# Patient Record
Sex: Female | Born: 1960 | Race: White | Hispanic: No | Smoking: Former smoker
Health system: Southern US, Community
[De-identification: ages and names within clinical notes are randomized; demographics above are authoritative.]

## PROBLEM LIST (undated history)

## (undated) DIAGNOSIS — C649 Malignant neoplasm of unspecified kidney, except renal pelvis: Secondary | ICD-10-CM

## (undated) DIAGNOSIS — Z8774 Personal history of (corrected) congenital malformations of heart and circulatory system: Secondary | ICD-10-CM

## (undated) DIAGNOSIS — G4700 Insomnia, unspecified: Secondary | ICD-10-CM

## (undated) DIAGNOSIS — R768 Other specified abnormal immunological findings in serum: Secondary | ICD-10-CM

## (undated) DIAGNOSIS — E785 Hyperlipidemia, unspecified: Secondary | ICD-10-CM

## (undated) DIAGNOSIS — K219 Gastro-esophageal reflux disease without esophagitis: Secondary | ICD-10-CM

## (undated) DIAGNOSIS — Z5181 Encounter for therapeutic drug level monitoring: Secondary | ICD-10-CM

## (undated) DIAGNOSIS — F329 Major depressive disorder, single episode, unspecified: Secondary | ICD-10-CM

## (undated) DIAGNOSIS — C787 Secondary malignant neoplasm of liver and intrahepatic bile duct: Secondary | ICD-10-CM

## (undated) DIAGNOSIS — Z7901 Long term (current) use of anticoagulants: Principal | ICD-10-CM

## (undated) DIAGNOSIS — Z8614 Personal history of Methicillin resistant Staphylococcus aureus infection: Secondary | ICD-10-CM

## (undated) DIAGNOSIS — M8580 Other specified disorders of bone density and structure, unspecified site: Secondary | ICD-10-CM

## (undated) DIAGNOSIS — I442 Atrioventricular block, complete: Secondary | ICD-10-CM

## (undated) DIAGNOSIS — Z950 Presence of cardiac pacemaker: Secondary | ICD-10-CM

## (undated) DIAGNOSIS — J309 Allergic rhinitis, unspecified: Secondary | ICD-10-CM

## (undated) DIAGNOSIS — F32A Depression, unspecified: Secondary | ICD-10-CM

## (undated) DIAGNOSIS — E559 Vitamin D deficiency, unspecified: Secondary | ICD-10-CM

## (undated) DIAGNOSIS — I513 Intracardiac thrombosis, not elsewhere classified: Secondary | ICD-10-CM

## (undated) DIAGNOSIS — D4989 Neoplasm of unspecified behavior of other specified sites: Secondary | ICD-10-CM

## (undated) DIAGNOSIS — M1990 Unspecified osteoarthritis, unspecified site: Secondary | ICD-10-CM

## (undated) DIAGNOSIS — C7800 Secondary malignant neoplasm of unspecified lung: Secondary | ICD-10-CM

## (undated) HISTORY — PX: CHOLECYSTECTOMY: SHX55

## (undated) HISTORY — DX: Secondary malignant neoplasm of unspecified lung: C78.00

## (undated) HISTORY — DX: Presence of cardiac pacemaker: Z95.0

## (undated) HISTORY — DX: Hyperlipidemia, unspecified: E78.5

## (undated) HISTORY — PX: CARDIAC SURGERY: SHX584

## (undated) HISTORY — DX: Other specified disorders of bone density and structure, unspecified site: M85.80

## (undated) HISTORY — DX: Encounter for therapeutic drug level monitoring: Z51.81

## (undated) HISTORY — DX: Vitamin D deficiency, unspecified: E55.9

## (undated) HISTORY — DX: Unspecified osteoarthritis, unspecified site: M19.90

## (undated) HISTORY — PX: ABDOMINAL HYSTERECTOMY: SHX81

## (undated) HISTORY — DX: Major depressive disorder, single episode, unspecified: F32.9

## (undated) HISTORY — PX: KNEE SURGERY: SHX244

## (undated) HISTORY — DX: Depression, unspecified: F32.A

## (undated) HISTORY — DX: Other specified abnormal immunological findings in serum: R76.8

## (undated) HISTORY — DX: Gastro-esophageal reflux disease without esophagitis: K21.9

## (undated) HISTORY — DX: Neoplasm of unspecified behavior of other specified sites: D49.89

## (undated) HISTORY — DX: Atrioventricular block, complete: I44.2

## (undated) HISTORY — DX: Allergic rhinitis, unspecified: J30.9

## (undated) HISTORY — DX: Insomnia, unspecified: G47.00

## (undated) HISTORY — DX: Intracardiac thrombosis, not elsewhere classified: I51.3

## (undated) HISTORY — DX: Long term (current) use of anticoagulants: Z79.01

## (undated) HISTORY — DX: Malignant neoplasm of unspecified kidney, except renal pelvis: C64.9

## (undated) HISTORY — PX: ELBOW SURGERY: SHX618

## (undated) HISTORY — DX: Personal history of Methicillin resistant Staphylococcus aureus infection: Z86.14

## (undated) HISTORY — DX: Secondary malignant neoplasm of liver and intrahepatic bile duct: C78.7

## (undated) HISTORY — DX: Personal history of (corrected) congenital malformations of heart and circulatory system: Z87.74

---

## 2008-04-29 ENCOUNTER — Ambulatory Visit: Payer: Self-pay | Admitting: Gastroenterology

## 2008-04-29 LAB — HM COLONOSCOPY

## 2008-05-06 ENCOUNTER — Emergency Department: Payer: Self-pay | Admitting: Emergency Medicine

## 2010-08-04 ENCOUNTER — Ambulatory Visit: Payer: Self-pay

## 2011-03-23 LAB — HM PAP SMEAR

## 2013-10-10 DIAGNOSIS — M8580 Other specified disorders of bone density and structure, unspecified site: Secondary | ICD-10-CM

## 2013-10-10 HISTORY — DX: Other specified disorders of bone density and structure, unspecified site: M85.80

## 2013-10-22 LAB — HM DEXA SCAN

## 2014-05-10 ENCOUNTER — Emergency Department: Payer: Self-pay | Admitting: Emergency Medicine

## 2014-11-16 DIAGNOSIS — E559 Vitamin D deficiency, unspecified: Secondary | ICD-10-CM | POA: Insufficient documentation

## 2014-11-16 DIAGNOSIS — Z8614 Personal history of Methicillin resistant Staphylococcus aureus infection: Secondary | ICD-10-CM | POA: Insufficient documentation

## 2014-11-16 DIAGNOSIS — K219 Gastro-esophageal reflux disease without esophagitis: Secondary | ICD-10-CM | POA: Insufficient documentation

## 2014-11-16 DIAGNOSIS — F329 Major depressive disorder, single episode, unspecified: Secondary | ICD-10-CM | POA: Insufficient documentation

## 2014-11-16 DIAGNOSIS — F32A Depression, unspecified: Secondary | ICD-10-CM | POA: Insufficient documentation

## 2014-11-16 DIAGNOSIS — E785 Hyperlipidemia, unspecified: Secondary | ICD-10-CM | POA: Insufficient documentation

## 2014-11-16 DIAGNOSIS — J309 Allergic rhinitis, unspecified: Secondary | ICD-10-CM | POA: Insufficient documentation

## 2014-11-16 DIAGNOSIS — G4700 Insomnia, unspecified: Secondary | ICD-10-CM | POA: Insufficient documentation

## 2014-11-19 ENCOUNTER — Ambulatory Visit (INDEPENDENT_AMBULATORY_CARE_PROVIDER_SITE_OTHER): Payer: 59 | Admitting: Family Medicine

## 2014-11-19 ENCOUNTER — Encounter: Payer: Self-pay | Admitting: Family Medicine

## 2014-11-19 VITALS — BP 105/66 | HR 79 | Temp 98.6°F | Ht 62.5 in | Wt 116.0 lb

## 2014-11-19 DIAGNOSIS — Z23 Encounter for immunization: Secondary | ICD-10-CM

## 2014-11-19 DIAGNOSIS — F32A Depression, unspecified: Secondary | ICD-10-CM

## 2014-11-19 DIAGNOSIS — R42 Dizziness and giddiness: Secondary | ICD-10-CM | POA: Diagnosis not present

## 2014-11-19 DIAGNOSIS — F329 Major depressive disorder, single episode, unspecified: Secondary | ICD-10-CM | POA: Diagnosis not present

## 2014-11-19 MED ORDER — SERTRALINE HCL 50 MG PO TABS: 50.0000 mg | ORAL_TABLET | Freq: Every day | ORAL | Status: AC

## 2014-11-19 MED ORDER — TRAZODONE HCL 100 MG PO TABS: 100.0000 mg | ORAL_TABLET | Freq: Every day | ORAL | Status: AC

## 2014-11-19 MED ORDER — MECLIZINE HCL 25 MG PO TABS: 25.0000 mg | ORAL_TABLET | Freq: Three times a day (TID) | ORAL | Status: DC | PRN

## 2014-11-19 NOTE — Progress Notes (Signed)
BP 105/66 mmHg  Pulse 79  Temp(Src) 98.6 F (37 C)  Ht 5' 2.5" (1.588 m)  Wt 116 lb (52.617 kg)  BMI 20.87 kg/m2  SpO2 99%   Subjective:    Patient ID: Tracy Mullins, female    DOB: April 03, 1960, 54 y.o.   MRN: [id number]  HPI: Tracy Mullins is a 54 y.o. female  Chief Complaint  Patient presents with  . Dizziness    had been having it for 2 weeks, but it has resolved now.   She thinks the marbles in her head have all gone back into place; she has had this before; she says that she used to take medicine for this, used to take antivert; would like refill; no head injury; no stroke symptoms, no headache  She says she needs some cuckoo medicine; she ran out of her medicine; I don't see any requests for her prescriptions; last dose was a month ago; she thinks the dizziness might be from stopping the medicine; cried; no trouble with sleep since she takes trazodone; no anxiety; weight pretty stable; no thoughts of self-harm or others  Relevant past medical, surgical, family and social history reviewed and updated as indicated. Interim medical history since our last visit reviewed. Allergies and medications reviewed and updated.  Review of Systems  Per HPI unless specifically indicated above     Objective:    BP 105/66 mmHg  Pulse 79  Temp(Src) 98.6 F (37 C)  Ht 5' 2.5" (1.588 m)  Wt 116 lb (52.617 kg)  BMI 20.87 kg/m2  SpO2 99%  Wt Readings from Last 3 Encounters:  11/19/14 116 lb (52.617 kg)  05/10/14 115 lb (52.164 kg)    Physical Exam  Constitutional: She appears well-developed and well-nourished.  Weight stable  HENT:  Head: Normocephalic and atraumatic.  Right Ear: Hearing, tympanic membrane, external ear and ear canal normal. Tympanic membrane is not injected, not erythematous and not retracted. No middle ear effusion.  Left Ear: Hearing, tympanic membrane, external ear and ear canal normal. Tympanic membrane is not injected, not erythematous and not retracted.   No middle ear effusion.  Nose: No rhinorrhea.  Mouth/Throat: Oropharynx is clear and moist and mucous membranes are normal. Mucous membranes are not pale. No oropharyngeal exudate, posterior oropharyngeal edema or posterior oropharyngeal erythema.  Eyes: EOM are normal. No scleral icterus.  Neck: Carotid bruit is not present.  Cardiovascular: Normal rate and regular rhythm.   Pulmonary/Chest: Effort normal and breath sounds normal.  Neurological: She is alert. She has normal strength. She displays no tremor. No cranial nerve deficit. She exhibits normal muscle tone. Gait normal.  Psychiatric: She has a normal mood and affect. Her speech is normal and behavior is normal.    Results for orders placed or performed in visit on 11/16/14  HM DEXA SCAN  Result Value Ref Range   HM Dexa Scan per PP   HM PAP SMEAR  Result Value Ref Range   HM Pap smear per PP   HM COLONOSCOPY  Result Value Ref Range   HM Colonoscopy per PP       Assessment & Plan:   Problem List Items Addressed This Visit      Other   Depression    Get started back on SSRI; refills provided      Relevant Medications   sertraline (ZOLOFT) 50 MG tablet   traZODone (DESYREL) 100 MG tablet   Dizziness - Primary    Likely combination of vertigo and  sensitivity to coming off of SSRI abruptly; meclizine Rx provided if needed in future; start back on SSRI, don't stop abruptly in future, would taper; no s/s of stroke or red flags       Other Visit Diagnoses    Encounter for immunization        flu shot given today       Follow up plan: Return in about 5 months (around 04/29/2015) for for fasting labs, physical .  Meds ordered this encounter  Medications  . sertraline (ZOLOFT) 50 MG tablet    Sig: Take 1 tablet (50 mg total) by mouth daily.    Dispense:  30 tablet    Refill:  12  . traZODone (DESYREL) 100 MG tablet    Sig: Take 1 tablet (100 mg total) by mouth at bedtime.    Dispense:  30 tablet    Refill:  12   . meclizine (ANTIVERT) 25 MG tablet    Sig: Take 1 tablet (25 mg total) by mouth 3 (three) times daily as needed for dizziness.    Dispense:  30 tablet    Refill:  0

## 2014-11-19 NOTE — Patient Instructions (Signed)
Please return on or after April 30, 2015 for fasting labs; okay to combine with your physical if due around then Get back on your medicines Take just half of a sertraline (zoloft) daily for the first four days and then a whole pill daily Use the meclizine (antivert) just if needed

## 2014-11-19 NOTE — Assessment & Plan Note (Signed)
Get started back on SSRI; refills provided

## 2014-11-19 NOTE — Assessment & Plan Note (Signed)
Likely combination of vertigo and sensitivity to coming off of SSRI abruptly; meclizine Rx provided if needed in future; start back on SSRI, don't stop abruptly in future, would taper; no s/s of stroke or red flags

## 2015-03-12 ENCOUNTER — Other Ambulatory Visit: Payer: Self-pay | Admitting: Family Medicine

## 2015-03-12 NOTE — Telephone Encounter (Signed)
Patient was due for follow-up in August and has not had kidney function checked in over a year Please let Tracy Mullins know that I'd like to see patient for an appointment here in the office for:   Please schedule a visit with me in the next: few weeks Fasting?  Yes please Thank you, Dr. Sanda Klein Back to me to refill the medicine once she's scheduled appt

## 2015-03-15 NOTE — Telephone Encounter (Signed)
Patient schedule f/u 03/30/14.

## 2015-03-31 ENCOUNTER — Encounter: Payer: Self-pay | Admitting: Family Medicine

## 2015-03-31 ENCOUNTER — Ambulatory Visit (INDEPENDENT_AMBULATORY_CARE_PROVIDER_SITE_OTHER): Payer: 59 | Admitting: Family Medicine

## 2015-03-31 VITALS — BP 106/66 | HR 93 | Temp 98.7°F | Ht 62.5 in | Wt 114.4 lb

## 2015-03-31 DIAGNOSIS — Z5181 Encounter for therapeutic drug level monitoring: Secondary | ICD-10-CM

## 2015-03-31 DIAGNOSIS — F329 Major depressive disorder, single episode, unspecified: Secondary | ICD-10-CM

## 2015-03-31 DIAGNOSIS — E785 Hyperlipidemia, unspecified: Secondary | ICD-10-CM | POA: Diagnosis not present

## 2015-03-31 DIAGNOSIS — E559 Vitamin D deficiency, unspecified: Secondary | ICD-10-CM | POA: Diagnosis not present

## 2015-03-31 DIAGNOSIS — G4700 Insomnia, unspecified: Secondary | ICD-10-CM

## 2015-03-31 DIAGNOSIS — K219 Gastro-esophageal reflux disease without esophagitis: Secondary | ICD-10-CM | POA: Diagnosis not present

## 2015-03-31 DIAGNOSIS — F32A Depression, unspecified: Secondary | ICD-10-CM

## 2015-03-31 MED ORDER — SIMVASTATIN 10 MG PO TABS: 10.0000 mg | ORAL_TABLET | Freq: Every day | ORAL | Status: DC

## 2015-03-31 MED ORDER — ASPIRIN EC 81 MG PO TBEC: 81.0000 mg | DELAYED_RELEASE_TABLET | Freq: Every day | ORAL | Status: DC

## 2015-03-31 MED ORDER — MELOXICAM 15 MG PO TABS: 15.0000 mg | ORAL_TABLET | Freq: Every day | ORAL | Status: DC | PRN

## 2015-03-31 NOTE — Assessment & Plan Note (Signed)
Doing well with trazodone; continue same

## 2015-03-31 NOTE — Progress Notes (Signed)
BP 106/66 mmHg  Pulse 93  Temp(Src) 98.7 F (37.1 C)  Ht 5' 2.5" (1.588 m)  Wt 114 lb 6.4 oz (51.891 kg)  BMI 20.58 kg/m2  SpO2 98%   Subjective:    Patient ID: Tracy Mullins, female    DOB: 1961-02-29, 55 y.o.   MRN: [id number]  HPI: Tracy Mullins is a 55 y.o. female  Chief Complaint  Patient presents with  . Hyperlipidemia  . Medication Refill    pt states she needs refill on meloxicam and simvastatin  . 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 Jefm Bryant; arthritis runs in the family  She takes sertraline; depression and anxiety; she feels like it is working well; wishes to stay on same dose  She takes trazodone for sleep; that is working well  We talked about shingles vaccine; recommended waiting until age 68  Relevant past medical, surgical, family and social history reviewed and updated as indicated Father had heart disease, massive heart attack at age 10 Interim medical history since our last visit reviewed. No medical excitement  Allergies and medications reviewed and updated. .med Review of Systems  Per HPI unless specifically indicated above     Objective:    BP 106/66 mmHg  Pulse 93  Temp(Src) 98.7 F (37.1 C)  Ht 5' 2.5" (1.588 m)  Wt 114 lb 6.4 oz (51.891 kg)  BMI 20.58 kg/m2  SpO2 98%  Wt Readings from Last 3 Encounters:  04/07/15 115 lb (52.164 kg)  03/31/15 114 lb 6.4 oz (51.891 kg)  11/19/14 116 lb  (52.617 kg)    Physical Exam  Constitutional: She appears well-developed and well-nourished. No distress.  Eyes: EOM are normal. No scleral icterus.  Cardiovascular: Normal rate and regular rhythm.   Pulmonary/Chest: Effort normal and breath sounds normal.  Abdominal: Soft. Bowel sounds are normal. She exhibits no distension.  Musculoskeletal: She exhibits no edema.  Neurological: She is alert.  Skin: Skin is warm. No pallor.  Psychiatric: She has a normal mood and affect.       Assessment & Plan:   Problem List Items Addressed This Visit      Digestive   GERD (gastroesophageal reflux disease)    Well-controlled; not bothered by NSAID; no blood in the stool        Other   Hyperlipidemia - Primary    Avoid saturated fats; check cholesterol today; continue statin      Relevant Orders   Lipid Panel w/o Chol/HDL Ratio (Completed)   Depression    Doing well on sertraline, continue same dose      Insomnia    Doing well with trazodone; continue same      Vitamin D deficiency disease    Check level and supplement today      Relevant Orders   VITAMIN D 25 Hydroxy (Vit-D Deficiency, Fractures) (Completed)   Medication monitoring encounter  Relevant Orders   CBC with Differential/Platelet (Completed)   Comprehensive metabolic panel (Completed)      Follow up plan: Return in about 6 months (around 09/28/2015) for thirty minute follow-up with fasting labs, keep CPE appt.  Orders Placed This Encounter  Procedures  . Lipid Panel w/o Chol/HDL Ratio  . CBC with Differential/Platelet  . Comprehensive metabolic panel  . VITAMIN D 25 Hydroxy (Vit-D Deficiency, Fractures)

## 2015-03-31 NOTE — Assessment & Plan Note (Signed)
Avoid saturated fats; check cholesterol today; continue statin

## 2015-03-31 NOTE — Assessment & Plan Note (Signed)
Well-controlled; not bothered by NSAID; no blood in the stool

## 2015-03-31 NOTE — Patient Instructions (Signed)
Try to limit saturated fats in your diet (bologna, hot dogs, barbeque, cheeseburgers, hamburgers, steak, bacon, sausage, cheese, etc.) and get more fresh fruits, vegetables, and whole grains You can get a shingles vaccine at age 56 We'll get labs today Start a baby 81 mg coated aspirin once a day for heart protection Take your meloxicam at least one hour AFTER the aspirin Return for your physical

## 2015-03-31 NOTE — Assessment & Plan Note (Signed)
Check level and supplement today

## 2015-03-31 NOTE — Assessment & Plan Note (Signed)
Doing well on sertraline, continue same dose

## 2015-04-01 LAB — LIPID PANEL W/O CHOL/HDL RATIO
Cholesterol, Total: 137 mg/dL (ref 100–199)
HDL: 50 mg/dL (ref >39)
LDL Calculated: 75 mg/dL (ref 0–99)
TRIGLYCERIDES: 61 mg/dL (ref 0–149)
VLDL CHOLESTEROL CAL: 12 mg/dL (ref 5–40)

## 2015-04-01 LAB — CBC WITH DIFFERENTIAL/PLATELET
BASOS: 1 %
Basophils Absolute: 0 10*3/uL (ref 0.0–0.2)
EOS (ABSOLUTE): 0.2 10*3/uL (ref 0.0–0.4)
EOS: 4 %
HEMATOCRIT: 27.5 % — AB (ref 34.0–46.6)
Hemoglobin: 8.7 g/dL — ABNORMAL LOW (ref 11.1–15.9)
Immature Grans (Abs): 0 10*3/uL (ref 0.0–0.1)
Immature Granulocytes: 0 %
LYMPHS ABS: 1.5 10*3/uL (ref 0.7–3.1)
Lymphs: 23 %
MCH: 23.3 pg — AB (ref 26.6–33.0)
MCHC: 31.6 g/dL (ref 31.5–35.7)
MCV: 74 fL — AB (ref 79–97)
MONOS ABS: 0.7 10*3/uL (ref 0.1–0.9)
Monocytes: 11 %
NEUTROS ABS: 3.9 10*3/uL (ref 1.4–7.0)
Neutrophils: 61 %
PLATELETS: 546 10*3/uL — AB (ref 150–379)
RBC: 3.73 x10E6/uL — ABNORMAL LOW (ref 3.77–5.28)
RDW: 15.1 % (ref 12.3–15.4)
WBC: 6.4 10*3/uL (ref 3.4–10.8)

## 2015-04-01 LAB — COMPREHENSIVE METABOLIC PANEL
A/G RATIO: 1.1 (ref 1.1–2.5)
ALK PHOS: 177 IU/L — AB (ref 39–117)
ALT: 7 IU/L (ref 0–32)
AST: 16 IU/L (ref 0–40)
Albumin: 3.5 g/dL (ref 3.5–5.5)
BILIRUBIN TOTAL: 0.2 mg/dL (ref 0.0–1.2)
BUN/Creatinine Ratio: 14 (ref 9–23)
BUN: 10 mg/dL (ref 6–24)
CALCIUM: 9.3 mg/dL (ref 8.7–10.2)
CHLORIDE: 100 mmol/L (ref 96–106)
CO2: 26 mmol/L (ref 18–29)
Creatinine, Ser: 0.72 mg/dL (ref 0.57–1.00)
GFR calc Af Amer: 109 mL/min/{1.73_m2} (ref >59)
GFR, EST NON AFRICAN AMERICAN: 95 mL/min/{1.73_m2} (ref >59)
GLOBULIN, TOTAL: 3.3 g/dL (ref 1.5–4.5)
Glucose: 88 mg/dL (ref 65–99)
POTASSIUM: 5 mmol/L (ref 3.5–5.2)
SODIUM: 140 mmol/L (ref 134–144)
Total Protein: 6.8 g/dL (ref 6.0–8.5)

## 2015-04-01 LAB — VITAMIN D 25 HYDROXY (VIT D DEFICIENCY, FRACTURES): Vit D, 25-Hydroxy: 22.9 ng/mL — ABNORMAL LOW (ref 30.0–100.0)

## 2015-04-05 ENCOUNTER — Telehealth: Payer: Self-pay | Admitting: Family Medicine

## 2015-04-05 DIAGNOSIS — D649 Anemia, unspecified: Secondary | ICD-10-CM | POA: Insufficient documentation

## 2015-04-05 DIAGNOSIS — R748 Abnormal levels of other serum enzymes: Secondary | ICD-10-CM

## 2015-04-05 NOTE — Telephone Encounter (Signed)
I have tried to call patient, unable to leave a message, tried different numbers in the voicemail system (reached recording) Nothing under media tab for scanned documents for other contacts I then tried [place], he said patient does not live there anymore, would not give new number ---------------------- [facility] staff --> I need to reach patient I cannot find any other numbers to contact her, and I need to talk with her about her labs Tell her I'm trying to reach her and that she is anemic and has other lab abnormalities Bring her in Wednesday for a recheck CBC and to see her ASAP Please try any way you can to reach her, get her to come in Wed morning if at all possible; double-book and we need an in-house CBC before I see her, along with other labs

## 2015-04-05 NOTE — Telephone Encounter (Signed)
Call patient with lab results; anemic

## 2015-04-06 ENCOUNTER — Other Ambulatory Visit: Payer: 59

## 2015-04-06 DIAGNOSIS — R748 Abnormal levels of other serum enzymes: Secondary | ICD-10-CM

## 2015-04-06 DIAGNOSIS — D649 Anemia, unspecified: Secondary | ICD-10-CM

## 2015-04-06 NOTE — Telephone Encounter (Signed)
I called patient, left detailed message; her hemoglobin is low at 8.1, she is losing even more blood; explained on machine that we usually transfuse when it gets under 8, so if she feels badly or gets worse or any issues overnight, go to the ER I want to see her in the office tomorrow; she can come in first thing, 7:50 am and I'll see her then or we can see her at the end of the morning Start iron 325 mg daily I have already put in referral to GI, other labs are pending ------------------- [facility] staff --> double book patient on Thursday, I'll see her whenever she can be here

## 2015-04-06 NOTE — Telephone Encounter (Signed)
Patient came in for labs, stool cards given.

## 2015-04-06 NOTE — Assessment & Plan Note (Signed)
Check labs today; refer to gastroenterologist; give stool cards to patient today

## 2015-04-06 NOTE — Telephone Encounter (Signed)
Pt called back and stated that she pricked her finger and it was 8.5. Pt also stated she was at work and the best number to reach her at is [phone].

## 2015-04-06 NOTE — Telephone Encounter (Signed)
Dr. Sanda Klein she can be reach at work [phone] or [phone]. 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

## 2015-04-06 NOTE — Telephone Encounter (Signed)
Patient is coming in today for labs per staff Thank you for the note; the anemia is real and it's her blood that had the abnormalities, not lab error then [facility] staff --> please give her stool cards; refer to gastroenterology for consideration of colonoscopy, EGD for microcytic anemia She should have two sets of labs to be drawn; if I misfired because she was coming in today but you can't see them all, come get me; she needs the liver stuff but also ferritin, iron, TIBC, etc

## 2015-04-06 NOTE — Telephone Encounter (Signed)
I spoke with patient; she does not feel bad; we talked about differential; she absolutely cannot come in today she says, but she can prick her finger at work and check Hemoglobin there; she'll do that now and call us back

## 2015-04-07 ENCOUNTER — Ambulatory Visit (INDEPENDENT_AMBULATORY_CARE_PROVIDER_SITE_OTHER): Payer: 59 | Admitting: Family Medicine

## 2015-04-07 ENCOUNTER — Ambulatory Visit
Admission: RE | Admit: 2015-04-07 | Discharge: 2015-04-07 | Disposition: A | Discharge status: Home / Self Care | Payer: Commercial Managed Care - HMO | Source: Ambulatory Visit | Attending: Family Medicine | Admitting: Family Medicine

## 2015-04-07 ENCOUNTER — Encounter: Payer: Self-pay | Admitting: Family Medicine

## 2015-04-07 VITALS — BP 116/69 | HR 70 | Temp 98.3°F | Wt 115.0 lb

## 2015-04-07 DIAGNOSIS — R748 Abnormal levels of other serum enzymes: Secondary | ICD-10-CM | POA: Diagnosis not present

## 2015-04-07 DIAGNOSIS — D649 Anemia, unspecified: Secondary | ICD-10-CM | POA: Insufficient documentation

## 2015-04-07 DIAGNOSIS — R51 Headache: Secondary | ICD-10-CM | POA: Diagnosis not present

## 2015-04-07 DIAGNOSIS — R519 Headache, unspecified: Secondary | ICD-10-CM

## 2015-04-07 DIAGNOSIS — D4102 Neoplasm of uncertain behavior of left kidney: Secondary | ICD-10-CM | POA: Insufficient documentation

## 2015-04-07 LAB — CBC WITH DIFFERENTIAL/PLATELET
HEMATOCRIT: 26 % — AB (ref 34.0–46.6)
HEMATOCRIT: 25.6 % — AB (ref 34.0–46.6)
HEMOGLOBIN: 8.1 g/dL — AB (ref 11.1–15.9)
HEMOGLOBIN: 7.9 g/dL — AB (ref 11.1–15.9)
LYMPHS ABS: 1.8 10*3/uL (ref 0.7–3.1)
Lymphocytes Absolute: 2 10*3/uL (ref 0.7–3.1)
Lymphs: 28 %
Lymphs: 31 %
MCH: 24.5 pg — ABNORMAL LOW (ref 26.6–33.0)
MCH: 24 pg — AB (ref 26.6–33.0)
MCHC: 31.2 g/dL — AB (ref 31.5–35.7)
MCHC: 30.9 g/dL — ABNORMAL LOW (ref 31.5–35.7)
MCV: 79 fL (ref 79–97)
MCV: 78 fL — AB (ref 79–97)
MID (ABSOLUTE): 0.9 10*3/uL (ref 0.1–1.6)
MID (Absolute): 0.8 10*3/uL (ref 0.1–1.6)
MID: 12 %
MID: 14 %
NEUTROS PCT: 61 %
Neutrophils Absolute: 3.9 10*3/uL (ref 1.4–7.0)
Neutrophils Absolute: 3.7 10*3/uL (ref 1.4–7.0)
Neutrophils: 55 %
Platelets: 521 10*3/uL — ABNORMAL HIGH (ref 150–379)
Platelets: 510 10*3/uL — ABNORMAL HIGH (ref 150–379)
RBC: 3.31 x10E6/uL — ABNORMAL LOW (ref 3.77–5.28)
RBC: 3.29 x10E6/uL — AB (ref 3.77–5.28)
RDW: 15.3 % (ref 12.3–15.4)
RDW: 15.3 % (ref 12.3–15.4)
WBC: 6.5 10*3/uL (ref 3.4–10.8)
WBC: 6.6 10*3/uL (ref 3.4–10.8)

## 2015-04-07 MED ORDER — PANTOPRAZOLE SODIUM 40 MG PO TBEC: 40.0000 mg | DELAYED_RELEASE_TABLET | Freq: Two times a day (BID) | ORAL | Status: DC

## 2015-04-07 MED ORDER — IOHEXOL 300 MG/ML  SOLN
100.0000 mL | Freq: Once | INTRAMUSCULAR | Status: AC | PRN
  Administered 2015-04-07: 100 mL via INTRAVENOUS

## 2015-04-07 NOTE — Telephone Encounter (Signed)
I spoke with her; she says she feels fine; I just see now that she was put on the schedule at 1:45 pm today; she feels fine and is going to be here soon; I told her I want a CT scan so we'll get that ordered now I told her to NOT eat or drink before we see her

## 2015-04-07 NOTE — Telephone Encounter (Signed)
Patient called back and stated she can get off work, she will come in at 1:45pm

## 2015-04-07 NOTE — Assessment & Plan Note (Addendum)
Recheck today shows that it has dropped even further from 8.7 to 8.1 to 7.9; stat abd/pelvic CT scan ordered; refer to GI for endoscopy (EGD and colonoscopy) if GI bleed, but Guaiac was negative today (only scant amount of stool present for testing); she will go from here to hospital for stat CT scan; urine ordered; patient instructed to stop all aspirin or NSAID products

## 2015-04-07 NOTE — Telephone Encounter (Addendum)
This message was closed when I got it -- it still requires action, so please continue to add and addend I had really wanted to see her in the office today  Ask patient to check her hemoglobin tomorrow morning at work; call us with results Start iron 325 mg once a day Watch stool for blood Please get her referral to GI [facility] and if GI can't see her, then refer her to general surgeon; she is going to need endoscopy/colonoscopy very very soon I also want a CT scan of her liver and pelvis with contrast today -- ordered placed STAT

## 2015-04-07 NOTE — Patient Instructions (Addendum)
We'll have you get a CT scan and see the gastroenterologist or surgeon pronto to work up your anemia and abnormal test Do not leave the hospital today until we talk about the results STOP any aspirin, meloxicam, non-steroidal medications, anything that might thin your blood or cause bleeding or ulcers START pantoprazole twice a day as an acid reducer Return tomorrow unless things change based on our CT results GINA --> work note

## 2015-04-07 NOTE — Progress Notes (Signed)
BP 116/69 mmHg  Pulse 70  Temp(Src) 98.3 F (36.8 C)  Wt 115 lb (52.164 kg)  SpO2 99%   Subjective:    Patient ID: Tracy Mullins, female    DOB: Dec 14, 1960, 55 y.o.   MRN: [id number]  HPI: Tracy Mullins is a 55 y.o. female  Chief Complaint  Patient presents with  . Anemia    follow up/recheck   Patient is here for an acute visit; she has been found to be anemic She says she thinks she knows what it is from, that she's not eating right She is not aware of any ancestors from the [place] She is not aware of any bleeding from anywhere; then, during the ROS questioning, she says that every once in a while, she'll wipe after going to the bathroom and the tissue with have a little bit of pinkish color  Her labs from one week ago and yesterday are as follows:  Ref Range 1d ago  7d ago      WBC 3.4 - 10.8 x10E3/uL 6.5 6.4    RBC 3.77 - 5.28 x10E6/uL 3.31 (L) 3.73 (L)    Hemoglobin 11.1 - 15.9 g/dL 8.1 (L) 8.7 (L)    Hematocrit 34.0 - 46.6 % 26.0 (L) 27.5 (L)    MCV 79 - 97 fL 79 74 (L)    MCH 26.6 - 33.0 pg 24.5 (L) 23.3 (L)    MCHC 31.5 - 35.7 g/dL 31.2 (L) 31.6    RDW 12.3 - 15.4 % 15.3 15.1    Platelets 150 - 379 x10E3/uL 521 (H) 546 (H)        Her alkaline phosphatase is elevated, and has gone up over the last week   Ref Range 1d ago  7d ago      Total Protein 6.0 - 8.5 g/dL 6.7 6.8    Albumin 3.5 - 5.5 g/dL 3.4 (L) 3.5    Bilirubin Total 0.0 - 1.2 mg/dL <0.2 0.2    Bilirubin, Direct 0.00 - 0.40 mg/dL 0.05         Alkaline Phosphatase 39 - 117 IU/L 209 (H) 177 (H)    AST 0 - 40 IU/L 23 16    ALT 0 - 32 IU/L 14 7          Ref Range 1d ago    Total Iron Binding Capacity 250 - 450 ug/dL 260   UIBC 131 - 425 ug/dL 244   Iron 27 - 159 ug/dL 16 (L)   Iron Saturation 15 - 55 % 6 (LL)        Relevant past medical, surgical, family and social history reviewed and updated as indicated Interim medical history since our last visit  reviewed. Allergies and medications reviewed and updated.  Health Maintenance  Topic Date Due  . MAMMOGRAM  07/06/2015 (Originally 04/04/2013)  . PAP SMEAR  04/06/2018 (Originally 03/22/2014)  . INFLUENZA VACCINE  10/11/2015  . COLONOSCOPY  04/29/2018  . TETANUS/TDAP  02/28/2023  . Hepatitis C Screening  Addressed  . HIV Screening  Addressed   Review of Systems  Constitutional: Positive for fever (a few weeks ago, but she took a motrin and it went away). Negative for unexpected weight change.  Respiratory: Positive for cough (dry cough, just like if you just get a cough).   Gastrointestinal: Positive for constipation and anal bleeding (little bit of blood on the toilet paper when she wiped, it was pinkish; she does that every once ina while). Negative for abdominal  pain and blood in stool.  Genitourinary: Negative for hematuria and flank pain.  Neurological: Positive for headaches (on and off for months, went to ER for this months ago; just come and go).  Per HPI unless specifically indicated above     Objective:    BP 116/69 mmHg  Pulse 70  Temp(Src) 98.3 F (36.8 C)  Wt 115 lb (52.164 kg)  SpO2 99%  Wt Readings from Last 3 Encounters:  04/07/15 115 lb (52.164 kg)  03/31/15 114 lb 6.4 oz (51.891 kg)  11/19/14 116 lb (52.617 kg)   body mass index is 20.69 kg/(m^2).  Physical Exam  Constitutional: She appears well-developed and well-nourished.  Thin frame, but weight overall stable  HENT:  Mouth/Throat: Oropharynx is clear and moist.  Eyes: EOM are normal. Pupils are equal, round, and reactive to light. No scleral icterus.  Neck: No JVD present. No thyromegaly present.  Cardiovascular: Normal rate and regular rhythm.   Pulmonary/Chest: Effort normal and breath sounds normal.  Abdominal: She exhibits mass (fullness midline, just left of midline halfway from umbilicus to xiphoid). She exhibits no distension. There is tenderness (mild) in the epigastric area and left upper  quadrant.  Genitourinary: Rectal exam shows no fissure and no mass. Guaiac negative stool.  Musculoskeletal: She exhibits no edema.  Neurological: She is alert. She displays no tremor. No cranial nerve deficit (no gross deficit). Coordination and gait normal.  Skin: Skin is warm and dry. No pallor (actually not pale relative to examiner).  Psychiatric: Her mood appears anxious.   Results for orders placed or performed in visit on 04/07/15  CBC With Differential/Platelet (STAT)  Result Value Ref Range   WBC 6.6 3.4 - 10.8 x10E3/uL   RBC 3.29 (L) 3.77 - 5.28 x10E6/uL   Hemoglobin 7.9 (L) 11.1 - 15.9 g/dL   Hematocrit 25.6 (L) 34.0 - 46.6 %   MCV 78 (L) 79 - 97 fL   MCH 24.0 (L) 26.6 - 33.0 pg   MCHC 30.9 (L) 31.5 - 35.7 g/dL   RDW 15.3 12.3 - 15.4 %   Platelets 510 (H) 150 - 379 x10E3/uL   Neutrophils 55 %   Lymphs 31 %   MID 14 %   Neutrophils Absolute 3.7 1.4 - 7.0 x10E3/uL   Lymphocytes Absolute 2.0 0.7 - 3.1 x10E3/uL   MID (Absolute) 0.9 0.1 - 1.6 X10E3/uL      Assessment & Plan:   Problem List Items Addressed This Visit      Other   Anemia - Primary    Recheck today shows that it has dropped even further from 8.7 to 8.1 to 7.9; stat abd/pelvic CT scan ordered; refer to GI for endoscopy (EGD and colonoscopy) if GI bleed, but Guaiac was negative today (only scant amount of stool present for testing); she will go from here to hospital for stat CT scan; urine ordered; patient instructed to stop all aspirin or NSAID products      Relevant Medications   ferrous sulfate 325 (65 FE) MG EC tablet   Other Relevant Orders   CBC With Differential/Platelet (STAT) (Completed)   Urinalysis, Routine w reflex microscopic (not at [facility])   Elevated alkaline phosphatase level    Discussed differential with patient; additional labs pending (alk phos isoenzyme); stat CT scan today ordered before lunch, she will go now; alk phos may be from liver or bone or intestine; liver transaminases are  normal however; intestinal malignancy needs to be ruled out; if bone, then concern for  bone marrow issue or mets from malignant process, but she feels so good      Headache    On and off for months; has been seen in the ER; worried today about what may be going on, not sure if tension headache; considered CT scan, but talked to radiologist and no way that drop in crit could be intracranial and she could be appearing this well, so I won't work that up as possible source of anemia; however, if malignant process found, will get head CT to look for mets         Follow up plan: Return in about 1 day (around 04/08/2015) for anemia. Insurance call: [employer]; if call is lost, [phone] I spoke with physician for peer-to-peer review Approved: [id number] valid until March 12th ------------------------ I received call about CT scan report I spoke with Cassandra in radiology, then spoke with urologist Dr. 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 Gwenith Daily; she will refer name to cancer navigators, they will call her to coordinate Patient was emotionally shook up by this news, expectedly, but had good support there and will be seen by [facility] ASAP for staging and surgery I am available if she needs anything at all Face-to-face time with patient was more than 50 minutes, >50% time spent counseling and coordination of care

## 2015-04-07 NOTE — Telephone Encounter (Signed)
I spoke with patient, she states she did not get your message last night. I informed her of what you had said. She that she can NOT leave work today since she left work early yesterday. She states she still does not feel bad and does not want a blood transfusion. She states that she can check her hemoglobin at work whenever she'd like to to monitor it. She will pick up the iron. What do you want her to do? You already have 18 on the schedule for tomorrow.

## 2015-04-07 NOTE — Assessment & Plan Note (Addendum)
Discussed differential with patient; additional labs pending (alk phos isoenzyme); stat CT scan today ordered before lunch, she will go now; alk phos may be from liver or bone or intestine; liver transaminases are normal however; intestinal malignancy needs to be ruled out; if bone, then concern for bone marrow issue or mets from malignant process, but she feels so good

## 2015-04-07 NOTE — Telephone Encounter (Signed)
Pt scheduled today for 1:45pm. Thanks.

## 2015-04-07 NOTE — Addendum Note (Signed)
Addended by: LADA, Satira Anis on: 04/07/2015 11:58 AM   Modules accepted: Orders

## 2015-04-08 ENCOUNTER — Telehealth: Payer: Self-pay | Admitting: Family Medicine

## 2015-04-08 ENCOUNTER — Ambulatory Visit: Payer: 59 | Admitting: Urology

## 2015-04-08 ENCOUNTER — Ambulatory Visit: Payer: 59 | Admitting: Family Medicine

## 2015-04-08 LAB — HEPATIC FUNCTION PANEL
ALT: 14 IU/L (ref 0–32)
AST: 23 IU/L (ref 0–40)
Albumin: 3.4 g/dL — ABNORMAL LOW (ref 3.5–5.5)
Alkaline Phosphatase: 209 IU/L — ABNORMAL HIGH (ref 39–117)
BILIRUBIN, DIRECT: 0.05 mg/dL (ref 0.00–0.40)
Bilirubin Total: <0.2 mg/dL (ref 0.0–1.2)
TOTAL PROTEIN: 6.7 g/dL (ref 6.0–8.5)

## 2015-04-08 LAB — IRON AND TIBC
IRON SATURATION: 6 % — AB (ref 15–55)
IRON: 16 ug/dL — AB (ref 27–159)
Total Iron Binding Capacity: 260 ug/dL (ref 250–450)
UIBC: 244 ug/dL (ref 131–425)

## 2015-04-08 LAB — ALKALINE PHOSPHATASE, ISOENZYMES
BONE FRACTION: 20 % (ref 14–68)
INTESTINAL FRAC.: 0 % (ref 0–18)
LIVER FRACTION: 80 % (ref 18–85)

## 2015-04-08 LAB — GAMMA GT: GGT: 30 IU/L (ref 0–60)

## 2015-04-08 LAB — FERRITIN: Ferritin: 380 ng/mL — ABNORMAL HIGH (ref 15–150)

## 2015-04-08 NOTE — Telephone Encounter (Signed)
All notes, labs, and CT scan faxed to [facility] at [phone].

## 2015-04-08 NOTE — Telephone Encounter (Signed)
I spoke with patient, advised her that we are thinking about her and wishing her the best. I advised her to call us if she needs anything.

## 2015-04-08 NOTE — Telephone Encounter (Signed)
Please forward all labs to [place] or urologist who is going to see her on Monday We talked about her labs last night Then please call patient and let her know we're thinking of her and to call us if there is anything we can do

## 2015-04-09 NOTE — Assessment & Plan Note (Signed)
On and off for months; has been seen in the ER; worried today about what may be going on, not sure if tension headache; considered CT scan, but talked to radiologist and no way that drop in crit could be intracranial and she could be appearing this well, so I won't work that up as possible source of anemia; however, if malignant process found, will get head CT to look for mets

## 2015-04-29 ENCOUNTER — Encounter: Payer: 59 | Admitting: Family Medicine

## 2015-05-06 DIAGNOSIS — C649 Malignant neoplasm of unspecified kidney, except renal pelvis: Secondary | ICD-10-CM | POA: Insufficient documentation

## 2015-05-27 ENCOUNTER — Other Ambulatory Visit
Admission: RE | Admit: 2015-05-27 | Discharge: 2015-05-27 | Disposition: A | Discharge status: Home / Self Care | Payer: 59 | Source: Ambulatory Visit | Attending: Urology | Admitting: Urology

## 2015-05-27 DIAGNOSIS — C649 Malignant neoplasm of unspecified kidney, except renal pelvis: Secondary | ICD-10-CM | POA: Diagnosis not present

## 2015-05-27 DIAGNOSIS — R109 Unspecified abdominal pain: Secondary | ICD-10-CM | POA: Diagnosis not present

## 2015-05-27 LAB — COMPREHENSIVE METABOLIC PANEL
ALT: 85 U/L — ABNORMAL HIGH (ref 14–54)
ANION GAP: 9 (ref 5–15)
AST: 73 U/L — AB (ref 15–41)
Albumin: 2.8 g/dL — ABNORMAL LOW (ref 3.5–5.0)
Alkaline Phosphatase: 733 U/L — ABNORMAL HIGH (ref 38–126)
BUN: 15 mg/dL (ref 6–20)
CHLORIDE: 98 mmol/L — AB (ref 101–111)
CO2: 24 mmol/L (ref 22–32)
Calcium: 8.7 mg/dL — ABNORMAL LOW (ref 8.9–10.3)
Creatinine, Ser: 0.86 mg/dL (ref 0.44–1.00)
Glucose, Bld: 92 mg/dL (ref 65–99)
POTASSIUM: 4.1 mmol/L (ref 3.5–5.1)
Sodium: 131 mmol/L — ABNORMAL LOW (ref 135–145)
Total Bilirubin: 0.3 mg/dL (ref 0.3–1.2)
Total Protein: 7.9 g/dL (ref 6.5–8.1)

## 2015-05-27 LAB — CBC WITH DIFFERENTIAL/PLATELET
BASOS ABS: 0 10*3/uL (ref 0–0.1)
Basophils Relative: 0 %
Eosinophils Absolute: 0.3 10*3/uL (ref 0–0.7)
Eosinophils Relative: 4 %
HCT: 22.9 % — ABNORMAL LOW (ref 35.0–47.0)
HEMOGLOBIN: 7.4 g/dL — AB (ref 12.0–16.0)
LYMPHS ABS: 0.9 10*3/uL — AB (ref 1.0–3.6)
LYMPHS PCT: 11 %
MCH: 24.1 pg — AB (ref 26.0–34.0)
MCHC: 32.3 g/dL (ref 32.0–36.0)
MCV: 74.8 fL — AB (ref 80.0–100.0)
Monocytes Absolute: 1 10*3/uL — ABNORMAL HIGH (ref 0.2–0.9)
Monocytes Relative: 12 %
NEUTROS ABS: 5.8 10*3/uL (ref 1.4–6.5)
NEUTROS PCT: 73 %
Platelets: 598 10*3/uL — ABNORMAL HIGH (ref 150–440)
RBC: 3.06 MIL/uL — AB (ref 3.80–5.20)
RDW: 20.5 % — ABNORMAL HIGH (ref 11.5–14.5)
WBC: 8 10*3/uL (ref 3.6–11.0)

## 2015-06-02 DIAGNOSIS — C787 Secondary malignant neoplasm of liver and intrahepatic bile duct: Secondary | ICD-10-CM

## 2015-06-02 DIAGNOSIS — C7800 Secondary malignant neoplasm of unspecified lung: Secondary | ICD-10-CM

## 2015-06-02 DIAGNOSIS — C649 Malignant neoplasm of unspecified kidney, except renal pelvis: Secondary | ICD-10-CM

## 2015-06-02 HISTORY — DX: Secondary malignant neoplasm of liver and intrahepatic bile duct: C78.7

## 2015-06-02 HISTORY — DX: Malignant neoplasm of unspecified kidney, except renal pelvis: C64.9

## 2015-06-02 HISTORY — DX: Secondary malignant neoplasm of unspecified lung: C78.00

## 2015-06-23 DIAGNOSIS — G893 Neoplasm related pain (acute) (chronic): Secondary | ICD-10-CM | POA: Insufficient documentation

## 2015-07-07 DIAGNOSIS — R768 Other specified abnormal immunological findings in serum: Secondary | ICD-10-CM

## 2015-07-07 HISTORY — DX: Other specified abnormal immunological findings in serum: R76.8

## 2015-08-17 ENCOUNTER — Other Ambulatory Visit
Admission: RE | Admit: 2015-08-17 | Discharge: 2015-08-17 | Disposition: A | Discharge status: Home / Self Care | Payer: 59 | Source: Ambulatory Visit | Attending: Medical Oncology | Admitting: Medical Oncology

## 2015-08-17 DIAGNOSIS — Z79899 Other long term (current) drug therapy: Secondary | ICD-10-CM | POA: Insufficient documentation

## 2015-08-17 LAB — COMPREHENSIVE METABOLIC PANEL
ALBUMIN: 3.2 g/dL — AB (ref 3.5–5.0)
ALT: 60 U/L — ABNORMAL HIGH (ref 14–54)
ANION GAP: 8 (ref 5–15)
AST: 42 U/L — ABNORMAL HIGH (ref 15–41)
Alkaline Phosphatase: 604 U/L — ABNORMAL HIGH (ref 38–126)
BUN: 23 mg/dL — ABNORMAL HIGH (ref 6–20)
CO2: 27 mmol/L (ref 22–32)
Calcium: 9.1 mg/dL (ref 8.9–10.3)
Chloride: 101 mmol/L (ref 101–111)
Creatinine, Ser: 0.84 mg/dL (ref 0.44–1.00)
GFR calc Af Amer: >60 mL/min (ref >60)
GFR calc non Af Amer: >60 mL/min (ref >60)
GLUCOSE: 86 mg/dL (ref 65–99)
POTASSIUM: 4.1 mmol/L (ref 3.5–5.1)
SODIUM: 136 mmol/L (ref 135–145)
TOTAL PROTEIN: 7.5 g/dL (ref 6.5–8.1)

## 2015-08-18 ENCOUNTER — Telehealth: Payer: Self-pay | Admitting: Family Medicine

## 2015-08-18 NOTE — Telephone Encounter (Signed)
Patient has appointment for 09-30-15. She is asking that you please send a refill of her cholesterol medication to [facility]

## 2015-08-18 NOTE — Telephone Encounter (Signed)
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

## 2015-09-22 ENCOUNTER — Ambulatory Visit: Payer: 59 | Admitting: Family Medicine

## 2015-09-30 ENCOUNTER — Ambulatory Visit: Payer: 59 | Admitting: Family Medicine

## 2015-11-05 DIAGNOSIS — I442 Atrioventricular block, complete: Secondary | ICD-10-CM

## 2015-11-05 DIAGNOSIS — D4989 Neoplasm of unspecified behavior of other specified sites: Secondary | ICD-10-CM | POA: Insufficient documentation

## 2015-11-05 DIAGNOSIS — Z8774 Personal history of (corrected) congenital malformations of heart and circulatory system: Secondary | ICD-10-CM

## 2015-11-05 HISTORY — DX: Atrioventricular block, complete: I44.2

## 2015-11-05 HISTORY — DX: Personal history of (corrected) congenital malformations of heart and circulatory system: Z87.74

## 2015-11-05 HISTORY — DX: Neoplasm of unspecified behavior of other specified sites: D49.89

## 2015-11-10 DIAGNOSIS — I240 Acute coronary thrombosis not resulting in myocardial infarction: Secondary | ICD-10-CM

## 2015-11-10 DIAGNOSIS — I513 Intracardiac thrombosis, not elsewhere classified: Secondary | ICD-10-CM

## 2015-11-10 DIAGNOSIS — Z950 Presence of cardiac pacemaker: Secondary | ICD-10-CM

## 2015-11-10 HISTORY — DX: Presence of cardiac pacemaker: Z95.0

## 2015-11-10 HISTORY — DX: Acute coronary thrombosis not resulting in myocardial infarction: I24.0

## 2015-11-15 ENCOUNTER — Ambulatory Visit (INDEPENDENT_AMBULATORY_CARE_PROVIDER_SITE_OTHER): Payer: 59

## 2015-11-15 ENCOUNTER — Telehealth: Payer: Self-pay | Admitting: Family Medicine

## 2015-11-15 ENCOUNTER — Ambulatory Visit: Payer: 59

## 2015-11-15 ENCOUNTER — Encounter: Payer: Self-pay | Admitting: Family Medicine

## 2015-11-15 DIAGNOSIS — Z7901 Long term (current) use of anticoagulants: Secondary | ICD-10-CM

## 2015-11-15 DIAGNOSIS — Z5181 Encounter for therapeutic drug level monitoring: Secondary | ICD-10-CM

## 2015-11-15 HISTORY — DX: Encounter for therapeutic drug level monitoring: Z79.01

## 2015-11-15 HISTORY — DX: Encounter for therapeutic drug level monitoring: Z51.81

## 2015-11-15 LAB — POCT INR: INR: 3

## 2015-11-15 NOTE — Assessment & Plan Note (Signed)
INR goal 2-3; INR was 3 today; recheck tomorrow to get two points; no previous values available; awaiting fax from tertiary care center

## 2015-11-15 NOTE — Telephone Encounter (Signed)
Patient arrived for INR check I spoke earlier to the staff at tertiary care hospital; we were hoping they would manage her warfarin; staff member said they didn't do that as CT surgery; they didn't set her up for cardiology f/u here locally I explained that I hoped they would at least provide guidance as to warfarin; goal INR, what her previous dose had been, how quickly or slowly it had come up, what least INR was etc; she said they would fax [place] records They were supposed to fax [place] information but it is nearly 10 pm and I still don't see anything in the chart (perhaps I'm not looking in the right place?) I tried to call patient x 2 but her voicemail has not been set up yet She has an appt tomorrow after lunch Will check INR tomorrow ----------------------------------- I finally did reach patient and she took 3 mg today and we'll check INR tomorrow

## 2015-11-15 NOTE — Progress Notes (Signed)
Erroneous encounter

## 2015-11-16 ENCOUNTER — Encounter: Payer: Self-pay | Admitting: Family Medicine

## 2015-11-16 ENCOUNTER — Ambulatory Visit (INDEPENDENT_AMBULATORY_CARE_PROVIDER_SITE_OTHER): Payer: 59 | Admitting: Family Medicine

## 2015-11-16 ENCOUNTER — Telehealth: Payer: Self-pay | Admitting: Family Medicine

## 2015-11-16 ENCOUNTER — Ambulatory Visit
Admission: RE | Admit: 2015-11-16 | Discharge: 2015-11-16 | Disposition: A | Discharge status: Home / Self Care | Payer: Commercial Managed Care - HMO | Source: Ambulatory Visit | Attending: Family Medicine | Admitting: Family Medicine

## 2015-11-16 ENCOUNTER — Inpatient Hospital Stay
Admission: EM | Admit: 2015-11-16 | Discharge: 2015-11-19 | DRG: 194 | Disposition: A | Discharge status: Home Health | Payer: Commercial Managed Care - HMO | Attending: Internal Medicine | Admitting: Internal Medicine

## 2015-11-16 ENCOUNTER — Encounter: Payer: Self-pay | Admitting: Emergency Medicine

## 2015-11-16 VITALS — BP 110/68 | HR 89 | Temp 97.5°F | Resp 14 | Wt 113.7 lb

## 2015-11-16 DIAGNOSIS — N390 Urinary tract infection, site not specified: Secondary | ICD-10-CM | POA: Diagnosis not present

## 2015-11-16 DIAGNOSIS — Z5181 Encounter for therapeutic drug level monitoring: Secondary | ICD-10-CM | POA: Diagnosis not present

## 2015-11-16 DIAGNOSIS — I513 Intracardiac thrombosis, not elsewhere classified: Secondary | ICD-10-CM | POA: Diagnosis not present

## 2015-11-16 DIAGNOSIS — Z7901 Long term (current) use of anticoagulants: Secondary | ICD-10-CM

## 2015-11-16 DIAGNOSIS — M1990 Unspecified osteoarthritis, unspecified site: Secondary | ICD-10-CM | POA: Diagnosis present

## 2015-11-16 DIAGNOSIS — Y95 Nosocomial condition: Secondary | ICD-10-CM | POA: Diagnosis present

## 2015-11-16 DIAGNOSIS — C7800 Secondary malignant neoplasm of unspecified lung: Secondary | ICD-10-CM

## 2015-11-16 DIAGNOSIS — J189 Pneumonia, unspecified organism: Secondary | ICD-10-CM

## 2015-11-16 DIAGNOSIS — I442 Atrioventricular block, complete: Secondary | ICD-10-CM

## 2015-11-16 DIAGNOSIS — F329 Major depressive disorder, single episode, unspecified: Secondary | ICD-10-CM | POA: Diagnosis present

## 2015-11-16 DIAGNOSIS — M8580 Other specified disorders of bone density and structure, unspecified site: Secondary | ICD-10-CM | POA: Diagnosis present

## 2015-11-16 DIAGNOSIS — D489 Neoplasm of uncertain behavior, unspecified: Secondary | ICD-10-CM

## 2015-11-16 DIAGNOSIS — R7989 Other specified abnormal findings of blood chemistry: Secondary | ICD-10-CM | POA: Diagnosis present

## 2015-11-16 DIAGNOSIS — Z79899 Other long term (current) drug therapy: Secondary | ICD-10-CM

## 2015-11-16 DIAGNOSIS — G893 Neoplasm related pain (acute) (chronic): Secondary | ICD-10-CM

## 2015-11-16 DIAGNOSIS — E0781 Sick-euthyroid syndrome: Secondary | ICD-10-CM | POA: Diagnosis present

## 2015-11-16 DIAGNOSIS — E785 Hyperlipidemia, unspecified: Secondary | ICD-10-CM | POA: Diagnosis present

## 2015-11-16 DIAGNOSIS — Z823 Family history of stroke: Secondary | ICD-10-CM

## 2015-11-16 DIAGNOSIS — C7989 Secondary malignant neoplasm of other specified sites: Secondary | ICD-10-CM | POA: Diagnosis present

## 2015-11-16 DIAGNOSIS — K219 Gastro-esophageal reflux disease without esophagitis: Secondary | ICD-10-CM | POA: Diagnosis present

## 2015-11-16 DIAGNOSIS — Z8249 Family history of ischemic heart disease and other diseases of the circulatory system: Secondary | ICD-10-CM

## 2015-11-16 DIAGNOSIS — J918 Pleural effusion in other conditions classified elsewhere: Secondary | ICD-10-CM

## 2015-11-16 DIAGNOSIS — R8271 Bacteriuria: Secondary | ICD-10-CM

## 2015-11-16 DIAGNOSIS — Z87891 Personal history of nicotine dependence: Secondary | ICD-10-CM | POA: Diagnosis not present

## 2015-11-16 DIAGNOSIS — I240 Acute coronary thrombosis not resulting in myocardial infarction: Secondary | ICD-10-CM | POA: Diagnosis present

## 2015-11-16 DIAGNOSIS — I517 Cardiomegaly: Secondary | ICD-10-CM

## 2015-11-16 DIAGNOSIS — Z7982 Long term (current) use of aspirin: Secondary | ICD-10-CM | POA: Diagnosis not present

## 2015-11-16 DIAGNOSIS — C649 Malignant neoplasm of unspecified kidney, except renal pelvis: Secondary | ICD-10-CM

## 2015-11-16 DIAGNOSIS — F32A Depression, unspecified: Secondary | ICD-10-CM | POA: Diagnosis present

## 2015-11-16 DIAGNOSIS — Z905 Acquired absence of kidney: Secondary | ICD-10-CM

## 2015-11-16 DIAGNOSIS — Z8614 Personal history of Methicillin resistant Staphylococcus aureus infection: Secondary | ICD-10-CM

## 2015-11-16 DIAGNOSIS — C787 Secondary malignant neoplasm of liver and intrahepatic bile duct: Secondary | ICD-10-CM | POA: Diagnosis present

## 2015-11-16 DIAGNOSIS — Z825 Family history of asthma and other chronic lower respiratory diseases: Secondary | ICD-10-CM

## 2015-11-16 DIAGNOSIS — R5082 Postprocedural fever: Secondary | ICD-10-CM | POA: Diagnosis not present

## 2015-11-16 DIAGNOSIS — R946 Abnormal results of thyroid function studies: Secondary | ICD-10-CM | POA: Diagnosis not present

## 2015-11-16 DIAGNOSIS — Z952 Presence of prosthetic heart valve: Secondary | ICD-10-CM | POA: Diagnosis not present

## 2015-11-16 DIAGNOSIS — R509 Fever, unspecified: Secondary | ICD-10-CM | POA: Insufficient documentation

## 2015-11-16 DIAGNOSIS — R8281 Pyuria: Secondary | ICD-10-CM

## 2015-11-16 DIAGNOSIS — Z950 Presence of cardiac pacemaker: Secondary | ICD-10-CM

## 2015-11-16 DIAGNOSIS — D4989 Neoplasm of unspecified behavior of other specified sites: Secondary | ICD-10-CM | POA: Diagnosis present

## 2015-11-16 DIAGNOSIS — E559 Vitamin D deficiency, unspecified: Secondary | ICD-10-CM | POA: Diagnosis present

## 2015-11-16 DIAGNOSIS — R748 Abnormal levels of other serum enzymes: Secondary | ICD-10-CM

## 2015-11-16 LAB — COMPLETE METABOLIC PANEL WITH GFR
ALBUMIN: 3.5 g/dL — AB (ref 3.6–5.1)
ALT: 95 U/L — AB (ref 6–29)
AST: 123 U/L — AB (ref 10–35)
Alkaline Phosphatase: 1222 U/L — ABNORMAL HIGH (ref 33–130)
BUN: 19 mg/dL (ref 7–25)
CALCIUM: 9.1 mg/dL (ref 8.6–10.4)
CHLORIDE: 98 mmol/L (ref 98–110)
CO2: 25 mmol/L (ref 20–31)
CREATININE: 1.29 mg/dL — AB (ref 0.50–1.05)
GFR, Est African American: 53 mL/min — ABNORMAL LOW (ref >=60)
GFR, Est Non African American: 46 mL/min — ABNORMAL LOW (ref >=60)
Glucose, Bld: 98 mg/dL (ref 65–99)
Potassium: 5.1 mmol/L (ref 3.5–5.3)
SODIUM: 132 mmol/L — AB (ref 135–146)
Total Bilirubin: 0.8 mg/dL (ref 0.2–1.2)
Total Protein: 7.2 g/dL (ref 6.1–8.1)

## 2015-11-16 LAB — CBC WITH DIFFERENTIAL/PLATELET
BASOS ABS: 0.1 10*3/uL (ref 0–0.1)
BASOS PCT: 0 %
BASOS PCT: 1 %
Basophils Absolute: 0 cells/uL (ref 0–200)
EOS ABS: 0.2 10*3/uL (ref 0–0.7)
EOS PCT: 1 %
EOS PCT: 2 %
Eosinophils Absolute: 103 cells/uL (ref 15–500)
HCT: 30.9 % — ABNORMAL LOW (ref 35.0–47.0)
HEMATOCRIT: 31 % — AB (ref 35.0–45.0)
HEMOGLOBIN: 9.9 g/dL — AB (ref 11.7–15.5)
Hemoglobin: 10.4 g/dL — ABNORMAL LOW (ref 12.0–16.0)
LYMPHS ABS: 1545 {cells}/uL (ref 850–3900)
LYMPHS PCT: 13 %
Lymphocytes Relative: 15 %
Lymphs Abs: 1.1 10*3/uL (ref 1.0–3.6)
MCH: 28 pg (ref 27.0–33.0)
MCH: 29.8 pg (ref 26.0–34.0)
MCHC: 31.9 g/dL — ABNORMAL LOW (ref 32.0–36.0)
MCHC: 33.7 g/dL (ref 32.0–36.0)
MCV: 87.8 fL (ref 80.0–100.0)
MCV: 88.4 fL (ref 80.0–100.0)
MONO ABS: 824 {cells}/uL (ref 200–950)
MONO ABS: 0.9 10*3/uL (ref 0.2–0.9)
MPV: 8.8 fL (ref 7.5–12.5)
Monocytes Relative: 8 %
Monocytes Relative: 10 %
Neutro Abs: 7828 cells/uL — ABNORMAL HIGH (ref 1500–7800)
Neutro Abs: 6.8 10*3/uL — ABNORMAL HIGH (ref 1.4–6.5)
Neutrophils Relative %: 76 %
Neutrophils Relative %: 74 %
PLATELETS: 345 10*3/uL (ref 150–440)
Platelets: 346 10*3/uL (ref 140–400)
RBC: 3.53 MIL/uL — AB (ref 3.80–5.10)
RBC: 3.5 MIL/uL — AB (ref 3.80–5.20)
RDW: 16.4 % — AB (ref 11.0–15.0)
RDW: 17 % — AB (ref 11.5–14.5)
WBC: 10.3 10*3/uL (ref 3.8–10.8)
WBC: 9.1 10*3/uL (ref 3.6–11.0)

## 2015-11-16 LAB — BASIC METABOLIC PANEL
ANION GAP: 9 (ref 5–15)
BUN: 22 mg/dL — ABNORMAL HIGH (ref 6–20)
CALCIUM: 9 mg/dL (ref 8.9–10.3)
CO2: 25 mmol/L (ref 22–32)
CREATININE: 1.15 mg/dL — AB (ref 0.44–1.00)
Chloride: 102 mmol/L (ref 101–111)
GFR, EST NON AFRICAN AMERICAN: 52 mL/min — AB (ref >60)
Glucose, Bld: 95 mg/dL (ref 65–99)
Potassium: 4.3 mmol/L (ref 3.5–5.1)
Sodium: 136 mmol/L (ref 135–145)

## 2015-11-16 LAB — LACTIC ACID, PLASMA: LACTIC ACID, VENOUS: 1.2 mmol/L (ref 0.5–1.9)

## 2015-11-16 LAB — POCT INR: INR: 4.9

## 2015-11-16 MED ORDER — LEVOFLOXACIN 500 MG PO TABS: 500.0000 mg | ORAL_TABLET | Freq: Every day | ORAL | 0 refills | Status: DC

## 2015-11-16 MED ORDER — ACETAMINOPHEN 325 MG PO TABS
650.0000 mg | ORAL_TABLET | Freq: Four times a day (QID) | ORAL | Status: DC | PRN
  Administered 2015-11-17 – 2015-11-19 (×3): 650 mg via ORAL
  Filled 2015-11-16 (×3): qty 2

## 2015-11-16 MED ORDER — FUROSEMIDE 40 MG PO TABS
40.0000 mg | ORAL_TABLET | Freq: Every day | ORAL | Status: DC
  Administered 2015-11-17 – 2015-11-19 (×3): 40 mg via ORAL
  Filled 2015-11-16 (×3): qty 1

## 2015-11-16 MED ORDER — ALBUTEROL SULFATE (2.5 MG/3ML) 0.083% IN NEBU: 2.5000 mg | INHALATION_SOLUTION | RESPIRATORY_TRACT | Status: DC | PRN

## 2015-11-16 MED ORDER — DEXTROSE 5 % IV SOLN
2.0000 g | Freq: Once | INTRAVENOUS | Status: AC
  Administered 2015-11-17: 2 g via INTRAVENOUS
  Filled 2015-11-16: qty 2

## 2015-11-16 MED ORDER — TRAZODONE HCL 100 MG PO TABS
100.0000 mg | ORAL_TABLET | Freq: Every day | ORAL | Status: DC
  Administered 2015-11-17 – 2015-11-18 (×3): 100 mg via ORAL
  Filled 2015-11-16 (×3): qty 1

## 2015-11-16 MED ORDER — OXYCODONE HCL 5 MG PO TABS
5.0000 mg | ORAL_TABLET | ORAL | Status: DC | PRN
  Administered 2015-11-17 – 2015-11-19 (×6): 5 mg via ORAL
  Filled 2015-11-16 (×6): qty 1

## 2015-11-16 MED ORDER — GUAIFENESIN-DM 100-10 MG/5ML PO SYRP: 5.0000 mL | ORAL_SOLUTION | ORAL | Status: DC | PRN

## 2015-11-16 MED ORDER — SERTRALINE HCL 50 MG PO TABS
50.0000 mg | ORAL_TABLET | Freq: Every day | ORAL | Status: DC
  Administered 2015-11-17 – 2015-11-19 (×3): 50 mg via ORAL
  Filled 2015-11-16 (×3): qty 1

## 2015-11-16 MED ORDER — ACETAMINOPHEN 650 MG RE SUPP: 650.0000 mg | Freq: Four times a day (QID) | RECTAL | Status: DC | PRN

## 2015-11-16 MED ORDER — SODIUM CHLORIDE 0.9% FLUSH
3.0000 mL | Freq: Two times a day (BID) | INTRAVENOUS | Status: DC
  Administered 2015-11-17 – 2015-11-18 (×3): 3 mL via INTRAVENOUS

## 2015-11-16 MED ORDER — GABAPENTIN 100 MG PO CAPS
100.0000 mg | ORAL_CAPSULE | Freq: Three times a day (TID) | ORAL | Status: DC
  Administered 2015-11-17 – 2015-11-19 (×8): 100 mg via ORAL
  Filled 2015-11-16 (×8): qty 1

## 2015-11-16 MED ORDER — ONDANSETRON HCL 4 MG PO TABS: 4.0000 mg | ORAL_TABLET | Freq: Four times a day (QID) | ORAL | Status: DC | PRN

## 2015-11-16 MED ORDER — WARFARIN SODIUM 1 MG PO TABS: 2.0000 mg | ORAL_TABLET | Freq: Every evening | ORAL | 0 refills | Status: DC

## 2015-11-16 MED ORDER — ASPIRIN 81 MG PO CHEW
81.0000 mg | CHEWABLE_TABLET | Freq: Every day | ORAL | Status: DC
  Administered 2015-11-17 – 2015-11-19 (×3): 81 mg via ORAL
  Filled 2015-11-16 (×3): qty 1

## 2015-11-16 MED ORDER — VANCOMYCIN HCL IN DEXTROSE 750-5 MG/150ML-% IV SOLN
750.0000 mg | INTRAVENOUS | Status: DC
  Administered 2015-11-17: 750 mg via INTRAVENOUS
  Filled 2015-11-16 (×2): qty 150

## 2015-11-16 MED ORDER — ONDANSETRON HCL 4 MG/2ML IJ SOLN: 4.0000 mg | Freq: Four times a day (QID) | INTRAMUSCULAR | Status: DC | PRN

## 2015-11-16 MED ORDER — DEXTROSE 5 % IV SOLN
2.0000 g | Freq: Two times a day (BID) | INTRAVENOUS | Status: DC
  Administered 2015-11-17 – 2015-11-19 (×5): 2 g via INTRAVENOUS
  Filled 2015-11-16 (×8): qty 2

## 2015-11-16 MED ORDER — VANCOMYCIN HCL IN DEXTROSE 1-5 GM/200ML-% IV SOLN
1000.0000 mg | Freq: Once | INTRAVENOUS | Status: AC
  Administered 2015-11-16: 1000 mg via INTRAVENOUS
  Filled 2015-11-16: qty 200

## 2015-11-16 NOTE — Assessment & Plan Note (Signed)
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

## 2015-11-16 NOTE — Assessment & Plan Note (Signed)
Check tSH

## 2015-11-16 NOTE — Assessment & Plan Note (Signed)
Continue anticoagulation 

## 2015-11-16 NOTE — Assessment & Plan Note (Signed)
Check urine.

## 2015-11-16 NOTE — Assessment & Plan Note (Addendum)
Check CBC and CXR; I put in a call to [place] surgeon on-call; did not receive call back prior to her leaving clinic, so I instructed her to contact them or go to ER if temp spikes again

## 2015-11-16 NOTE — Patient Instructions (Addendum)
Please go across the street from here to the outpatient imaging center to get a chest xray If your fever returns, call your doctor at [facility] or go to the emergency department Dr. Ubaldo Glassing will see you at [facility] on Friday September 15th at 1:15 pm; please arrive 15 minutes early to check-in Try to eat a consistent diet in regards to vitamin K Hold your Coumadin (warfarin) today and follow-up with Dr. Bethanne Ginger office for further instructions He will probably direct you to take 2 or 2.5 mg of Coumadin (warfarin) daily and recheck soon, but we'll respectfully defer that to his judgment If you do not hear back from his office by tomorrow afternoon, please call them or call us for assistance Continue to use the incentive spirometer to fill the bases of your lungs with air and help prevent pneumonia

## 2015-11-16 NOTE — Progress Notes (Signed)
BP 110/68   Pulse 89   Temp 97.5 F (36.4 C) (Oral)   Resp 14   Wt 113 lb 11.2 oz (51.6 kg)   SpO2 94%   BMI 20.46 kg/m    Subjective:    Patient ID: Tracy Mullins, female    DOB: Jan 30, 1961, 55 y.o.   MRN: [id number]  HPI: Tracy Mullins is a 55 y.o. female  Chief Complaint  Patient presents with  . 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; she had this resected, along with closure of a PFO and pacemaker placement and tricuspid valve replacement CTS discharge summary received today and reviewed in detail with patient  Had a temperature last night of 101.0; no chills; no sweats No cough No burning or odor with urination She received transfusions  Echocardiogram 10/27/15 reviewed in detail with patient Multiple chest xrays which showed bilateral pleural effusion, atelectasis, no infiltrate; she has the incentive spirometry and is using that at home  She feels like her breathing is getting better day by day; just a little cough when she first wakes up; hurts with cough; no bad pain; if post-op pain, using tylenol and oxycodone; pain right now is a 3 out of 10 No swelling in the legs  Stroke discovered on scan in the hospital; right-handed, but 4th and 5th fingers are numb and tingling in the fingers; not getting any better; patient thinks this happened a while back; left superior cerebella artery territory; recalls seeing a floater in the left eye only; no weakness anywhere else  MRI reviewed; most likely stroke but solitary met possible  Creatinine 1.2 on day of discharge WBC 9.9 on day of discharge  On warfarin now, 2.4, 2.4, 2.1, 2.0 prior to discharge INR was 3.0 yesterday and now 4.9 today On 3 mg daily since discharge No  nosebleeds, no gum bleeding, no blood in urine or stool  No dry mouth, moving bowels okay Tired; reviewed more of the [facility] labs; TSH 17.16 on August 17th  Depression screen [facility] 2/9 11/16/2015 03/31/2015  Decreased Interest 0 0  Down, Depressed, Hopeless 0 0  PHQ - 2 Score 0 0   Relevant past medical, surgical, family and social history reviewed Past Medical History:  Diagnosis Date  . Allergic rhinitis   . Carcinoma, renal cell ([place]) 06/02/2015  . 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 CHB (complete heart block) ([place]) 11/05/2015  . Depression   . GERD (gastroesophageal reflux disease)    pt states was never dx with GERD  . History of MRSA infection   . Hyperlipidemia   . Insomnia   . Metastasis to liver ([place]) 06/02/2015  . Metastatic cancer to lung ([place]) 06/02/2015  . Monitoring for anticoagulant use 11/15/2015   INR 2-3  . OA (osteoarthritis)   . Osteopenia Aug 2015   femoral neck T score -1.1  . 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 Because of the presence of atypical antibodies, please allow 4 hours for the Transfusion Service to find compatible blood for this patient.  . 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 S/P patent foramen ovale closure 11/05/2015  . S/P placement of  cardiac pacemaker 11/10/2015   Overview:  11/09/15  . Vitamin D deficiency disease    Past Surgical History:  Procedure Laterality Date  . ABDOMINAL HYSTERECTOMY     partial, ovaries remain; due to heavy bleeding  . CARDIAC SURGERY    . CESAREAN SECTION     x 2  . CHOLECYSTECTOMY    . ELBOW SURGERY    . KNEE SURGERY     Family History  Problem Relation Age of Onset  . Stroke Mother   . Hyperlipidemia Mother   . Heart attack Father   . Heart disease Father   . Cancer Maternal Aunt     breast  . COPD Maternal Aunt   . Diabetes Neg Hx    Social History  Substance Use Topics    . Smoking status: Former Smoker    Packs/day: 0.50    Years: 32.00    Types: Cigarettes    Quit date: 01/11/2011  . Smokeless tobacco: Never Used  . Alcohol use No     Comment: occasional   Interim medical history since last visit reviewed. Allergies and medications reviewed  Review of Systems Per HPI unless specifically indicated above     Objective:    BP 110/68   Pulse 89   Temp 97.5 F (36.4 C) (Oral)   Resp 14   Wt 113 lb 11.2 oz (51.6 kg)   SpO2 94%   BMI 20.46 kg/m   Wt Readings from Last 3 Encounters:  11/19/15 116 lb 3.2 oz (52.7 kg)  11/16/15 113 lb 11.2 oz (51.6 kg)  04/07/15 115 lb (52.2 kg)    Physical Exam  Constitutional: She appears well-developed and well-nourished. No distress.  Nontoxic, but appears tired  HENT:  Head: Normocephalic and atraumatic.  Eyes: EOM are normal. No scleral icterus.  Neck: No thyromegaly present.  Cardiovascular: Normal rate, regular rhythm and normal heart sounds.   No murmur heard. Pulmonary/Chest: Effort normal. No respiratory distress. She has decreased breath sounds (both bases right moreso than left). She has no wheezes. She has no rhonchi. She has no rales.  Midline sternotomy incision c/d/i, chest tube insertion sites also c/d/i without active drainage, no fluctuance, no surrounding erythema  Abdominal: Soft. Bowel sounds are normal. She exhibits no distension.  Musculoskeletal: Normal range of motion. She exhibits no edema.  Neurological: She is alert. She exhibits normal muscle tone.  Skin: Skin is warm and dry. She is not diaphoretic. No pallor.  Psychiatric: She has a normal mood and affect. Her behavior is normal. Judgment and thought content normal. Her mood appears not anxious. She does not exhibit a depressed mood.      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, removed at [place], continue anticoagulation      Relevant Medications   aspirin 81 MG chewable tablet   Other Relevant Orders   DG Chest 2 View (Completed)     Respiratory   Metastatic cancer  to lung [facility])    Check CXR; reviewed prior CXR reports from recent hospitalization      Relevant Medications   aspirin 81 MG chewable tablet     Digestive   Metastasis to liver [facility])    Renal cell with mets to the liver; recheck liver function tests      Relevant Medications   aspirin 81 MG chewable tablet     Genitourinary   Carcinoma, renal cell (HCC)    Active, with multiple metastases; continue to f/u with cancer doctor      Relevant Medications   aspirin 81 MG chewable tablet   Bacteriuria with pyuria    Check urine      Relevant Orders   Urinalysis w microscopic + reflex cultur (Completed)     Other   S/P placement of cardiac pacemaker    Would like patient to see local cardiologist, Dr. Ubaldo Glassing      Relevant Orders   DG Chest 2 View (Completed)   Monitoring for anticoagulant use    INR shows supratherapeutic level today; hold Coumadin today; I respectfully requested the [place] cardiologist manage her INR, given her complex case and recent significant surgery; I had spoken to the CT surgeon yesterday about asking them to manage her INR and they politely declined      Fever    Check CBC and CXR; I put in a call to [place] surgeon on-call; did not receive call back prior to her leaving clinic, so I instructed her to contact them or go to ER if temp spikes again      Relevant Orders   CBC with Differential/Platelet (Completed)   DG Chest 2 View (Completed)   Elevated alkaline  phosphatase level    Most likely from mets to liver and bone; recheck level today; f/u with cancer specialist      Cancer associated pain    Managed with oxycodone; patient did not need refill when asked today; pain control adequate      Abnormal thyroid function test    Check tSH      Relevant Orders   TSH (Completed)    Other Visit Diagnoses    Encounter for medication monitoring       Relevant Orders   POCT INR (Completed)   CBC with Differential/Platelet (Completed)   COMPLETE METABOLIC PANEL WITH GFR (Completed)      Follow up plan: No Follow-up on file.  An after-visit summary was printed and given to the patient at [facility].  Please see the patient instructions which may contain other information and recommendations beyond what is mentioned above in the assessment and plan.  Meds ordered this encounter  Medications  . aspirin 81 MG chewable tablet    Sig: Chew 1 tablet (81 mg total) by mouth daily.  Marland Kitchen DISCONTD: warfarin (COUMADIN) 1 MG tablet    Sig: Take 2-2.5 tablets (2-2.5 mg total) by mouth every evening. Or as directed by your doctor    Dispense:  75 tablet    Refill:  0    Orders Placed This Encounter  Procedures  . DG Chest 2 View  . CBC with Differential/Platelet  . COMPLETE METABOLIC PANEL WITH GFR  . Urinalysis w microscopic + reflex cultur  . TSH  . POCT INR    Next Friday; 1:15 pm with Dr. Ubaldo Glassing Hold coumadin today, await his instructions Face-to-face time with patient was more than 40 minutes, >50% time spent counseling and coordination of care

## 2015-11-16 NOTE — Assessment & Plan Note (Signed)
Would like patient to see local cardiologist, Dr. Ubaldo Glassing

## 2015-11-16 NOTE — Progress Notes (Signed)
Pharmacy Antibiotic Note  Tracy Mullins is a 55 y.o. female admitted on 11/16/2015 with pneumonia.  Pharmacy has been consulted for vancomycin and cefepime dosing.  Plan: DW 51.3kg  Vd 36L kei 0.038 hr-1  T1/2 18 hours Vancomycin 7509 mg q 24 hours ordered with stacked dosing. Level before 5th dose. Goal trough 15-20.  Cefepime 2 grams q 12 hours ordered.  Height: 5\' 1"  (154.9 cm) Weight: 113 lb (51.3 kg) IBW/kg (Calculated) : 47.8  Temp (24hrs), Avg:97.7 F (36.5 C), Min:97.5 F (36.4 C), Max:97.9 F (36.6 C)   Recent Labs Lab 11/16/15 1503 11/16/15 2055  WBC 10.3 9.1  CREATININE  --  1.15*  LATICACIDVEN  --  1.2    Estimated Creatinine Clearance: 41.2 mL/min (by C-G formula based on SCr of 1.15 mg/dL).    Allergies  Allergen Reactions  . Hydrocodone Nausea And Vomiting    Antimicrobials this admission: vancomycin  >>  cefepime  >>   Dose adjustments this admission:   Microbiology results: 9/6 BCx: pending 9/6 Sputum: pending    9/6 CXR: RLL pneumonia  Thank you for allowing pharmacy to be a part of this patient's care.  Zohaib Heeney S 11/16/2015 11:16 PM

## 2015-11-16 NOTE — Telephone Encounter (Signed)
I called patient, tried twice, could not reach her, not answering I reached out to her daughter; explained she has RLL We discussed concern for getting sick very quickly with her immune system, chemo, recent surgery, fragile state; she will get in touch with her mother and get her to the emergency department at [facility] I reached out to Dr. Otelia Sergeant, CT surgery resident on call for Dr. Ruby Cola; explained new RLL, he agrees they'll be happy to take care of her in transfer --------------------- I spoke with daughter; she'll get pt to closest hospital and can transfer if needed --------------------- I spoke to Marya Amsler, charge nurse in ER; explained case, gave him Dr. Maxine Glenn name for transfer if needed

## 2015-11-16 NOTE — Assessment & Plan Note (Signed)
pacemaker 

## 2015-11-16 NOTE — H&P (Signed)
[facility] NAME: Tracy Mullins    MR#:  [id number]  DATE OF BIRTH:  May 19, 1960  DATE OF ADMISSION:  11/16/2015  PRIMARY CARE PHYSICIAN: Enid Derry, MD   REQUESTING/REFERRING PHYSICIAN: Archie Balboa, MD  CHIEF COMPLAINT:   Chief Complaint  Patient presents with  . Shortness of Breath    HISTORY OF PRESENT ILLNESS:  Tracy Mullins  is a 55 y.o. female who presents with Increasing shortness of breath. Patient was recently at [facility] where she had heart surgery done to remove a mass from her heart. She went to see her primary care physician as part of her routine follow-up, and mentioned to him that she had some shortness of breath. He got a chest x-ray which is very worrisome for pneumonia. He sent her to the ED, workup here showed the same. Hospitals were called for admission and treatment.  PAST MEDICAL HISTORY:   Past Medical History:  Diagnosis Date  . Allergic rhinitis   . Carcinoma, renal cell ([employer]) 06/02/2015  . 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 CHB (complete heart block) ([place]) 11/05/2015  . Depression   . GERD (gastroesophageal reflux disease)    pt states was never dx with GERD  . History of MRSA infection   . Hyperlipidemia   . Insomnia   . Metastasis to liver ([place]) 06/02/2015  . Metastatic cancer to lung ([place]) 06/02/2015  . Monitoring for anticoagulant use 11/15/2015   INR 2-3  . OA (osteoarthritis)   . Osteopenia Aug 2015   femoral neck T score -1.1  . 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 Because of the presence of atypical antibodies, please allow 4 hours for the Transfusion Service to find compatible blood for this patient.  . 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 S/P patent foramen ovale closure 11/05/2015  . S/P placement of cardiac pacemaker 11/10/2015   Overview:  11/09/15  .  Vitamin D deficiency disease     PAST SURGICAL HISTORY:   Past Surgical History:  Procedure Laterality Date  . ABDOMINAL HYSTERECTOMY     partial, ovaries remain; due to heavy bleeding  . CARDIAC SURGERY    . CESAREAN SECTION     x 2  . CHOLECYSTECTOMY    . ELBOW SURGERY    . KNEE SURGERY      SOCIAL HISTORY:   Social History  Substance Use Topics  . Smoking status: Former Smoker    Packs/day: 0.50    Years: 32.00    Types: Cigarettes    Quit date: 01/11/2011  . Smokeless tobacco: Never Used  . Alcohol use No     Comment: occasional    FAMILY HISTORY:   Family History  Problem Relation Age of Onset  . Stroke Mother   . Hyperlipidemia Mother   . Heart attack Father   . Heart disease Father   . Cancer Maternal Aunt     breast  . COPD Maternal Aunt   . Diabetes Neg Hx     DRUG ALLERGIES:   Allergies  Allergen Reactions  . Hydrocodone Nausea And Vomiting    MEDICATIONS AT HOME:   Prior to Admission medications   Medication Sig Start Date End Date Taking? Authorizing Provider  acetaminophen (TYLENOL) 325 MG tablet Take 2 tablets by mouth every 6 (six) hours as needed. 11/13/15   Historical Provider, MD  aspirin 81 MG chewable tablet Chew 1 tablet (81 mg total) by mouth daily. 11/16/15   Arnetha Courser, MD  furosemide (LASIX) 40 MG tablet Take 1 tablet by mouth daily. 11/13/15   Historical Provider, MD  gabapentin (NEURONTIN) 100 MG capsule Take 1 capsule by mouth 3 (three) times daily. 11/13/15 11/27/15  Historical Provider, MD  oxyCODONE (OXY IR/ROXICODONE) 5 MG immediate release tablet Take 1 tablet by mouth every 6 (six) hours as needed. 11/13/15 11/23/15  Historical Provider, MD  polyethylene glycol (MIRALAX / GLYCOLAX) packet Take 1 packet by mouth daily as needed. 11/13/15   Historical Provider, MD  potassium chloride SA (K-DUR,KLOR-CON) 20 MEQ tablet Take 1 tablet by mouth daily. 11/13/15   Historical Provider, MD  sertraline (ZOLOFT) 50 MG tablet Take 1 tablet (50 mg  total) by mouth daily. 11/19/14   Arnetha Courser, MD  traZODone (DESYREL) 100 MG tablet Take 1 tablet (100 mg total) by mouth at bedtime. 11/19/14   Arnetha Courser, MD  warfarin (COUMADIN) 1 MG tablet Take 2-2.5 tablets (2-2.5 mg total) by mouth every evening. Or as directed by your doctor 11/16/15   Arnetha Courser, MD    REVIEW OF SYSTEMS:  Review of Systems  Constitutional: Negative for chills, fever, malaise/fatigue and weight loss.  HENT: Negative for ear pain, hearing loss and tinnitus.   Eyes: Negative for blurred vision, double vision, pain and redness.  Respiratory: Positive for shortness of breath. Negative for cough and hemoptysis.   Cardiovascular: Positive for chest pain (Right lower thorax, worse with deep breathing). Negative for palpitations, orthopnea and leg swelling.  Gastrointestinal: Negative for abdominal pain, constipation, diarrhea, nausea and vomiting.  Genitourinary: Negative for dysuria, frequency and hematuria.  Musculoskeletal: Negative for back pain, joint pain and neck pain.  Skin:       No acne, rash, or lesions  Neurological: Negative for dizziness, tremors, focal weakness and weakness.  Endo/Heme/Allergies: Negative for polydipsia. Does not bruise/bleed easily.  Psychiatric/Behavioral: Negative for depression. The patient is not nervous/anxious and does not have insomnia.      VITAL SIGNS:   Vitals:   11/16/15 1958 11/16/15 1959 11/16/15 2100 11/16/15 2130  BP: 120/86  105/69 108/70  Pulse: 78  80 77  Resp: 18   20  Temp: 97.9 F (36.6 C)     TempSrc: Oral     SpO2: 96%  94% 93%  Weight:  51.3 kg (113 lb)    Height:  5\' 1"  (1.549 m)     Wt Readings from Last 3 Encounters:  11/16/15 51.3 kg (113 lb)  11/16/15 51.6 kg (113 lb 11.2 oz)  04/07/15 52.2 kg (115 lb)    PHYSICAL EXAMINATION:  Physical Exam  Vitals reviewed. Constitutional: She is oriented to person, place, and time. She appears well-developed and well-nourished. No distress.  HENT:   Head: Normocephalic and atraumatic.  Mouth/Throat: Oropharynx is clear and moist.  Eyes: Conjunctivae and EOM are normal. Pupils are equal, round, and reactive to light. No scleral icterus.  Neck: Normal range of motion. Neck supple. No JVD present. No thyromegaly present.  Cardiovascular: Normal rate, regular rhythm and intact distal pulses.  Exam reveals no gallop and no friction rub.   No murmur heard. Respiratory: Effort normal. No respiratory distress. She has no wheezes.  Right lower lobe rhonchi and crackles  GI: Soft. Bowel sounds are normal. She exhibits no distension. There is no tenderness.  Musculoskeletal: Normal range of motion. She exhibits no edema.  No arthritis, no gout  Lymphadenopathy:    She has no cervical adenopathy.  Neurological: She is alert and oriented to person, place, and time. No cranial nerve deficit.  No dysarthria, no aphasia  Skin: Skin is warm and dry. No rash noted. No erythema.  Psychiatric: She has a normal mood and affect. Her behavior is normal. Judgment and thought content normal.    LABORATORY PANEL:   CBC  Recent Labs Lab 11/16/15 2055  WBC 9.1  HGB 10.4*  HCT 30.9*  PLT 345   ------------------------------------------------------------------------------------------------------------------  Chemistries   Recent Labs Lab 11/16/15 2055  NA 136  K 4.3  CL 102  CO2 25  GLUCOSE 95  BUN 22*  CREATININE 1.15*  CALCIUM 9.0   ------------------------------------------------------------------------------------------------------------------  Cardiac Enzymes No results for input(s): TROPONINI in the last 168 hours. ------------------------------------------------------------------------------------------------------------------  RADIOLOGY:  Dg Chest 2 View  Result Date: 11/16/2015 CLINICAL DATA:  Open heart surgery for right ventricular tumor and mural thrombus removal as well as pacemaker placement left week. Patient reports  cough and fever to 101.1 degrees last night. History of lung and liver malignancy. EXAM: CHEST  2 VIEW COMPARISON:  PA and lateral chest x-ray of May 10, 2014 FINDINGS: The lungs are mildly hyperinflated. There is infiltrate in the right lower lobe posterior laterally. There is a small right pleural effusion. The left lung is well-expanded. The interstitial markings of both lungs are increased. The cardiac silhouette is mildly enlarged. The circular pattern of radiodense markers is present and may be in the tricuspid position. The pulmonary vascularity is not clearly engorged. The sternal wires are intact. There is mild soft tissue prominence of the retrosternal region. The pacemaker leads are in reasonable position. There is calcification in the wall of the aortic arch. There is mild multilevel degenerative disc disease of the thoracic spine. IMPRESSION: Acute right lower lobe pneumonia with small right pleural effusion. Mild pulmonary interstitial edema. Mild cardiomegaly without significant pulmonary vascular congestion. Post median sternotomy changes with small amount of fluid in the retrosternal region. Followup PA and lateral chest X-ray is recommended in 3-4 weeks following trial of antibiotic therapy to ensure resolution and exclude underlying malignancy. Electronically Signed   By: Zolton Dowson  [place] M.D.   On: 11/16/2015 16:41    EKG:  No orders found for this or any previous visit.  IMPRESSION AND PLAN:  Principal Problem:   HCAP (healthcare-associated pneumonia) - broad spectrum IV antibiotics ordered and blood culture sent from the ED, we will add a sputum culture. When necessary antitussive. Active Problems:   History of MRSA infection - we will cover her with vancomycin upfront for now.   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 Management plans discussed with the patient and/or family.  DVT PROPHYLAXIS: Systemic anticoagulation  GI PROPHYLAXIS: None  ADMISSION STATUS: Inpatient  CODE STATUS: Full Code Status History    This patient does not have a recorded code status. Please follow your organizational policy for patients in this situation.      TOTAL TIME TAKING CARE OF THIS PATIENT: 45 minutes.    Lemon Whitacre Montesano 11/16/2015, 10:41 PM  [employer]  Office  [phone]  CC: Primary care physician; Enid Derry, MD

## 2015-11-16 NOTE — ED Provider Notes (Signed)
[facility] Emergency Department Provider Note    ____________________________________________   I have reviewed the triage vital signs and the nursing notes.   HISTORY  Chief Complaint Shortness of Breath   History limited by: Not Limited   HPI Tracy Mullins is a 55 y.o. female who presents to the emergency department today because of concern for pneumonia seen on outpatient CXR. The patient had the CXR performed because of concern for fever that she noted the night before. The patient recently was discharged from [facility] after an admission and had been doing okay. On a follow-up appointment with her primary doctor today she mentioned that she had a fever last night. Because of this an infectious disease workup was started. Outpatient chest x-ray did show a pneumonia. Patient denies any significant shortness of breath or cough.   Past Medical History:  Diagnosis Date  . Allergic rhinitis   . Carcinoma, renal cell ([place]) 06/02/2015  . 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 CHB (complete heart block) ([place]) 11/05/2015  . Depression   . GERD (gastroesophageal reflux disease)    pt states was never dx with GERD  . History of MRSA infection   . Hyperlipidemia   . Insomnia   . Metastasis to liver ([place]) 06/02/2015  . Metastatic cancer to lung ([place]) 06/02/2015  . Monitoring for anticoagulant use 11/15/2015   INR 2-3  . OA (osteoarthritis)   . Osteopenia Aug 2015   femoral neck T score -1.1  . 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 Because of the presence of atypical antibodies, please allow 4 hours for the Transfusion Service to find compatible blood for this patient.  . 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 S/P patent foramen ovale closure 11/05/2015  . S/P placement of cardiac pacemaker 11/10/2015   Overview:  11/09/15  . Vitamin  D deficiency disease     Patient Active Problem List   Diagnosis Date Noted  . Fever 11/16/2015  . Abnormal thyroid function test 11/16/2015  . Bacteriuria with pyuria 11/16/2015  . Monitoring for anticoagulant use 11/15/2015  . RV (right ventricular) mural thrombus without MI ([place]) 11/10/2015  . S/P placement of cardiac pacemaker 11/10/2015  . 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 CHB (complete heart block) ([place]) 11/05/2015  . S/P patent foramen ovale closure 11/05/2015  . Red blood cell antibody positive, compatible PRBC difficult to obtain 07/07/2015  . Cancer associated pain 06/23/2015  . Carcinoma, renal cell ([place]) 06/02/2015  . Metastatic cancer to lung ([employer]) 06/02/2015  . Metastasis to liver ([place]) 06/02/2015  . Headache 04/07/2015  . Anemia 04/05/2015  . Elevated alkaline phosphatase level 04/05/2015  . Medication monitoring encounter 03/31/2015  . Dizziness 11/19/2014  . History of MRSA infection   . Hyperlipidemia   . Depression   . GERD (gastroesophageal reflux disease)   . Insomnia   . Allergic rhinitis   . Vitamin D deficiency disease   . Osteopenia 10/10/2013    Past Surgical History:  Procedure Laterality Date  . ABDOMINAL HYSTERECTOMY     partial, ovaries remain; due to heavy bleeding  . CARDIAC SURGERY    . CESAREAN SECTION     x 2  . CHOLECYSTECTOMY    . ELBOW SURGERY    . KNEE SURGERY      Prior to Admission medications   Medication Sig Start Date End Date Taking?  Authorizing Provider  acetaminophen (TYLENOL) 325 MG tablet Take 2 tablets by mouth every 6 (six) hours as needed. 11/13/15   Historical Provider, MD  aspirin 81 MG chewable tablet Chew 1 tablet (81 mg total) by mouth daily. 11/16/15   Arnetha Courser, MD  furosemide (LASIX) 40 MG tablet Take 1 tablet by mouth daily. 11/13/15   Historical Provider, MD  gabapentin (NEURONTIN) 100 MG capsule Take 1 capsule by mouth 3 (three) times daily. 11/13/15 11/27/15  Historical Provider, MD  oxyCODONE (OXY  IR/ROXICODONE) 5 MG immediate release tablet Take 1 tablet by mouth every 6 (six) hours as needed. 11/13/15 11/23/15  Historical Provider, MD  polyethylene glycol (MIRALAX / GLYCOLAX) packet Take 1 packet by mouth daily as needed. 11/13/15   Historical Provider, MD  potassium chloride SA (K-DUR,KLOR-CON) 20 MEQ tablet Take 1 tablet by mouth daily. 11/13/15   Historical Provider, MD  sertraline (ZOLOFT) 50 MG tablet Take 1 tablet (50 mg total) by mouth daily. 11/19/14   Arnetha Courser, MD  traZODone (DESYREL) 100 MG tablet Take 1 tablet (100 mg total) by mouth at bedtime. 11/19/14   Arnetha Courser, MD  warfarin (COUMADIN) 1 MG tablet Take 2-2.5 tablets (2-2.5 mg total) by mouth every evening. Or as directed by your doctor 11/16/15   Arnetha Courser, MD    Allergies Hydrocodone  Family History  Problem Relation Age of Onset  . Stroke Mother   . Hyperlipidemia Mother   . Heart attack Father   . Heart disease Father   . Cancer Maternal Aunt     breast  . COPD Maternal Aunt   . Diabetes Neg Hx     Social History Social History  Substance Use Topics  . Smoking status: Former Smoker    Packs/day: 0.50    Years: 32.00    Types: Cigarettes    Quit date: 01/11/2011  . Smokeless tobacco: Never Used  . Alcohol use No     Comment: occasional    Review of Systems  Constitutional: Positive for fever. Cardiovascular: Negative for chest pain. Respiratory: Negative for shortness of breath. Gastrointestinal: Negative for abdominal pain, vomiting and diarrhea. Neurological: Negative for headaches, focal weakness or numbness.  10-point ROS otherwise negative.  ____________________________________________   PHYSICAL EXAM:  VITAL SIGNS: ED Triage Vitals  Enc Vitals Group     BP 11/16/15 1958 120/86     Pulse Rate 11/16/15 1958 78     Resp 11/16/15 1958 18     Temp 11/16/15 1958 97.9 F (36.6 C)     Temp Source 11/16/15 1958 Oral     SpO2 11/16/15 1958 96 %     Weight 11/16/15 1959 113 lb (51.3  kg)     Height 11/16/15 1959 5\' 1"  (1.549 m)   Constitutional: Alert and oriented. Well appearing and in no distress. Eyes: Conjunctivae are normal. Normal extraocular movements. ENT   Head: Normocephalic and atraumatic.   Nose: No congestion/rhinnorhea.   Mouth/Throat: Mucous membranes are moist.   Neck: No stridor. Hematological/Lymphatic/Immunilogical: No cervical lymphadenopathy. Cardiovascular: Normal rate, regular rhythm.  No murmurs, rubs, or gallops. Respiratory: Normal respiratory effort without tachypnea nor retractions. Rhonchi appreciated in the right lower quadrant.  Gastrointestinal: Soft and nontender. No distention.  Genitourinary: Deferred Musculoskeletal: Normal range of motion in all extremities. No lower extremity edema. Neurologic:  Normal speech and language. No gross focal neurologic deficits are appreciated.  Skin:  Skin is warm, dry and intact. No rash noted. Psychiatric: Mood and  affect are normal. Speech and behavior are normal. Patient exhibits appropriate insight and judgment.  ____________________________________________    LABS (pertinent positives/negatives)  Labs Reviewed  CBC WITH DIFFERENTIAL/PLATELET - Abnormal; Notable for the following:       Result Value   RBC 3.50 (*)    Hemoglobin 10.4 (*)    HCT 30.9 (*)    RDW 17.0 (*)    Neutro Abs 6.8 (*)    All other components within normal limits  BASIC METABOLIC PANEL - Abnormal; Notable for the following:    BUN 22 (*)    Creatinine, Ser 1.15 (*)    GFR calc non Af Amer 52 (*)    All other components within normal limits  CULTURE, BLOOD (ROUTINE X 2)  CULTURE, BLOOD (ROUTINE X 2)  CULTURE, EXPECTORATED SPUTUM-ASSESSMENT  LACTIC ACID, PLASMA  BASIC METABOLIC PANEL  CBC     ____________________________________________   EKG  None  ____________________________________________     RADIOLOGY  None  ____________________________________________   PROCEDURES  Procedures  ____________________________________________   INITIAL IMPRESSION / ASSESSMENT AND PLAN / ED COURSE  Pertinent labs & imaging results that were available during my care of the patient were reviewed by me and considered in my medical decision making (see chart for details).  Patient presented to the emergency department today because of outpatient chest x-ray concerning for pneumonia. Patient recently had a long hospitalization at [facility] at during which time she underwent open heart surgery. Patient's blood work without any overly concerning findings safe for a mild left shift. Patient will be given broad-spectrum antibiotics here in the emergency department to cover for healthcare associated pneumonia. I did discuss with Dr. Ruby Cola at [facility]. He felt comfortable with her receiving her care here at [facility]. Family would prefer to have patient admitted at [facility]. Will discuss with hospitalist.   Clinical Course   Hospitalist comfortable with admission here. ____________________________________________   FINAL CLINICAL IMPRESSION(S) / ED DIAGNOSES  Final diagnoses:  Healthcare-associated pneumonia     Note: This dictation was prepared with Dragon dictation. Any transcriptional errors that result from this process are unintentional    Nance Pear, MD 11/16/15 2255

## 2015-11-16 NOTE — ED Triage Notes (Signed)
Pt ambulatory to triage with steady gait with c/o shortness of breath, pt reports was dx from [facility] 3 days ago for heart valve replacement, removal of cancer in heart. Pt states saw PCP today and was told to come to ED, pt was diagnosed with pneumonia. Pt states yesterday had a temp of 101, but denies fever today. Pt alert and oriented, respirations even and unlabored, pt speaking in complete sentences.

## 2015-11-16 NOTE — Progress Notes (Signed)
ANTICOAGULATION CONSULT NOTE - Initial Consult  Pharmacy Consult for warfarin dosing Indication: RV thrombus  Allergies  Allergen Reactions  . Hydrocodone Nausea And Vomiting    Patient Measurements: Height: 5\' 1"  (154.9 cm) Weight: 113 lb (51.3 kg) IBW/kg (Calculated) : 47.8 Heparin Dosing Weight: n/a  Vital Signs: Temp: 97.9 F (36.6 C) (09/06 1958) Temp Source: Oral (09/06 1958) BP: 111/74 (09/06 2300) Pulse Rate: 79 (09/06 2300)  Labs:  Recent Labs  11/15/15 1011 11/16/15 1357 11/16/15 1503 11/16/15 2055  HGB  --   --  9.9* 10.4*  HCT  --   --  31.0* 30.9*  PLT  --   --  346 345  INR 3.0 4.9  --   --   CREATININE  --   --   --  1.15*    Estimated Creatinine Clearance: 41.2 mL/min (by C-G formula based on SCr of 1.15 mg/dL).   Medical History: Past Medical History:  Diagnosis Date  . Allergic rhinitis   . Carcinoma, renal cell ([place]) 06/02/2015  . 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 CHB (complete heart block) ([place]) 11/05/2015  . Depression   . GERD (gastroesophageal reflux disease)    pt states was never dx with GERD  . History of MRSA infection   . Hyperlipidemia   . Insomnia   . Metastasis to liver ([place]) 06/02/2015  . Metastatic cancer to lung ([place]) 06/02/2015  . Monitoring for anticoagulant use 11/15/2015   INR 2-3  . OA (osteoarthritis)   . Osteopenia Aug 2015   femoral neck T score -1.1  . 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 Because of the presence of atypical antibodies, please allow 4 hours for the Transfusion Service to find compatible blood for this patient.  . 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 S/P patent foramen ovale closure 11/05/2015  . S/P placement of cardiac pacemaker 11/10/2015   Overview:  11/09/15  . Vitamin D deficiency disease     Medications:  Home warfarin dose 2-2.5 mg daily per med  rec.  Assessment: INR 4.9  Goal of Therapy:  INR 2-3    Plan:  INR supratherapeutic on admission. Will recheck with tomorrow AM labs. Resume warfarin dosing when INR at acceptable level.  Shailyn Weyandt S 11/16/2015,11:21 PM

## 2015-11-17 LAB — URINALYSIS W MICROSCOPIC + REFLEX CULTURE
BACTERIA UA: NONE SEEN [HPF]
BILIRUBIN URINE: NEGATIVE
CASTS: NONE SEEN [LPF]
CRYSTALS: NONE SEEN [HPF]
Glucose, UA: NEGATIVE
HGB URINE DIPSTICK: NEGATIVE
Leukocytes, UA: NEGATIVE
Nitrite: NEGATIVE
RBC / HPF: NONE SEEN RBC/HPF (ref <=2)
SPECIFIC GRAVITY, URINE: 1.025 (ref 1.001–1.035)
Yeast: NONE SEEN [HPF]
pH: 5.5 (ref 5.0–8.0)

## 2015-11-17 LAB — BASIC METABOLIC PANEL
Anion gap: 8 (ref 5–15)
BUN: 21 mg/dL — AB (ref 6–20)
CALCIUM: 8.9 mg/dL (ref 8.9–10.3)
CHLORIDE: 104 mmol/L (ref 101–111)
CO2: 26 mmol/L (ref 22–32)
CREATININE: 1.28 mg/dL — AB (ref 0.44–1.00)
GFR calc non Af Amer: 46 mL/min — ABNORMAL LOW (ref >60)
GFR, EST AFRICAN AMERICAN: 53 mL/min — AB (ref >60)
Glucose, Bld: 112 mg/dL — ABNORMAL HIGH (ref 65–99)
Potassium: 4.7 mmol/L (ref 3.5–5.1)
SODIUM: 138 mmol/L (ref 135–145)

## 2015-11-17 LAB — CBC
HCT: 28.8 % — ABNORMAL LOW (ref 35.0–47.0)
Hemoglobin: 9.8 g/dL — ABNORMAL LOW (ref 12.0–16.0)
MCH: 30.6 pg (ref 26.0–34.0)
MCHC: 34 g/dL (ref 32.0–36.0)
MCV: 90.1 fL (ref 80.0–100.0)
PLATELETS: 341 10*3/uL (ref 150–440)
RBC: 3.19 MIL/uL — ABNORMAL LOW (ref 3.80–5.20)
RDW: 17.2 % — AB (ref 11.5–14.5)
WBC: 6.9 10*3/uL (ref 3.6–11.0)

## 2015-11-17 LAB — TSH: TSH: 18.74 m[IU]/L — AB

## 2015-11-17 LAB — PROTIME-INR
INR: 3.59
PROTHROMBIN TIME: 36.7 s — AB (ref 11.4–15.2)

## 2015-11-17 LAB — T4, FREE: Free T4: 0.82 ng/dL (ref 0.61–1.12)

## 2015-11-17 MED ORDER — MORPHINE SULFATE (PF) 2 MG/ML IV SOLN: 2.0000 mg | Freq: Once | INTRAVENOUS | Status: DC

## 2015-11-17 MED ORDER — OXYCODONE HCL 5 MG PO TABS
5.0000 mg | ORAL_TABLET | Freq: Once | ORAL | Status: AC
Start: 2015-11-17 — End: 2015-11-17
  Administered 2015-11-17: 5 mg via ORAL
  Filled 2015-11-17: qty 1

## 2015-11-17 NOTE — Discharge Instructions (Signed)

## 2015-11-17 NOTE — Care Management (Signed)
Spoke with patient who is from home and independent. Recently discharged from [facility] from open heart surgery and history of renal CA. Patietn doe snto use [employer] and drives self.. Stated that she doesn't need any help or want anyone in her home. "I am too young for that" I am only 58" Patient stated that she has the help of her two children that look in on her. Anticipated discharge is home with self care.

## 2015-11-17 NOTE — Progress Notes (Addendum)
[facility] [place] NAME: Tracy Mullins    MR#:  [id number]  DATE OF BIRTH:  01-31-1961  SUBJECTIVE:  CHIEF COMPLAINT:   Chief Complaint  Patient presents with  . 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  HENT: Negative for ear discharge, ear pain and nosebleeds.   Respiratory: Positive for cough and shortness of breath. Negative for wheezing.   Cardiovascular: Positive for chest pain. Negative for palpitations and leg swelling.  Gastrointestinal: Negative for abdominal pain, constipation, diarrhea, nausea and vomiting.  Genitourinary: Negative for dysuria.  Musculoskeletal: Negative for back pain, myalgias and neck pain.  Skin: Negative for rash.  Neurological: Negative for dizziness, sensory change, speech change, focal weakness, seizures and headaches.  Psychiatric/Behavioral: Negative for depression.    DRUG ALLERGIES:   Allergies  Allergen Reactions  . Hydrocodone Nausea And Vomiting    VITALS:  Blood pressure 105/63, pulse 88, temperature 98.1 F (36.7 C), temperature source Oral, resp. rate 19, height 5\' 1"  (1.549 m), weight 53.4 kg (117 lb 11.6 oz), SpO2 94 %.  PHYSICAL EXAMINATION:  Physical Exam  GENERAL:  55 y.o.-year-old patient lying in the bed with no acute distress.  EYES: Pupils equal, round, reactive to light and accommodation. No scleral icterus. Extraocular muscles intact.  HEENT: Head atraumatic, normocephalic. Oropharynx and nasopharynx clear.  NECK:  Supple, no jugular venous distention. No thyroid  enlargement, no tenderness.  LUNGS: Normal breath sounds bilaterally except right base, no wheezing, rales,rhonchi or crepitation. No use of accessory muscles of respiration.  CARDIOVASCULAR: Healing mid sternal incision. S1, S2 normal. No rubs, or gallops. 3/6 loud systolic murmur ABDOMEN: Soft, nontender, nondistended. Bowel sounds present. No organomegaly or mass.  EXTREMITIES: No pedal edema, cyanosis, or clubbing.  NEUROLOGIC: Cranial nerves II through XII are intact. Muscle strength 5/5 in all extremities. Sensation intact. Gait not checked.  PSYCHIATRIC: The patient is alert and oriented x 3.  SKIN: No obvious rash, lesion, or ulcer.    LABORATORY PANEL:   CBC  Recent Labs Lab 11/17/15 0343  WBC 6.9  HGB 9.8*  HCT 28.8*  PLT 341   ------------------------------------------------------------------------------------------------------------------  Chemistries   Recent Labs Lab 11/16/15 1503  11/17/15 0343  NA 132*  < > 138  K 5.1  < > 4.7  CL 98  < > 104  CO2 25  < > 26  GLUCOSE 98  < > 112*  BUN 19  < > 21*  CREATININE 1.29*  < > 1.28*  CALCIUM 9.1  < > 8.9  AST 123*  --   --   ALT 95*  --   --   ALKPHOS 1,222*  --   --   BILITOT 0.8  --   --   < > = values in this interval not displayed. ------------------------------------------------------------------------------------------------------------------  Cardiac Enzymes No results for input(s): TROPONINI in the last 168 hours. ------------------------------------------------------------------------------------------------------------------  RADIOLOGY:  Dg Chest 2 View  Result Date: 11/16/2015 CLINICAL DATA:  Open heart surgery for right ventricular tumor and mural thrombus removal as well as pacemaker placement left week. Patient  reports cough and fever to 101.1 degrees last night. History of lung and liver malignancy. EXAM: CHEST  2 VIEW COMPARISON:  PA and lateral chest x-ray of May 10, 2014 FINDINGS: The  lungs are mildly hyperinflated. There is infiltrate in the right lower lobe posterior laterally. There is a small right pleural effusion. The left lung is well-expanded. The interstitial markings of both lungs are increased. The cardiac silhouette is mildly enlarged. The circular pattern of radiodense markers is present and may be in the tricuspid position. The pulmonary vascularity is not clearly engorged. The sternal wires are intact. There is mild soft tissue prominence of the retrosternal region. The pacemaker leads are in reasonable position. There is calcification in the wall of the aortic arch. There is mild multilevel degenerative disc disease of the thoracic spine. IMPRESSION: Acute right lower lobe pneumonia with small right pleural effusion. Mild pulmonary interstitial edema. Mild cardiomegaly without significant pulmonary vascular congestion. Post median sternotomy changes with small amount of fluid in the retrosternal region. Followup PA and lateral chest X-ray is recommended in 3-4 weeks following trial of antibiotic therapy to ensure resolution and exclude underlying malignancy. Electronically Signed   By: David  Martinique M.D.   On: 11/16/2015 16:41    EKG:  No orders found for this or any previous visit.  ASSESSMENT AND PLAN:   55 y/o Female with past medical history significant for stage IV renal cell carcinoma diagnosed in January 2017 status post left nephrectomy, metastatic cystoscopy part, pericardium, lung and liver who was recently at [facility] for cardiac surgery with tricuspid valve replacement, resection of mural thrombus, status post pacemaker for complete heart block, PFO closure, arthritis presents to hospital secondary to fevers and noted to have pneumonia.  #1 healthcare acquired pneumonia-recent prolonged hospitalization at [facility] and just got discharged last week. -Chest x-ray with right lower lobe infiltrate. Pending blood cultures. Advanced continues to spike fevers.  Continue broad-spectrum antibiotics with vancomycin and cefepime.  #2 stage IV renal cell carcinoma with metastasis to the heart, lungs, liver-following at [facility]. Has an oncology follow-up.  -Receiving oral chemotherapy by mouth. We'll be restarted after seen by oncology.  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 -Also had complete heart block status post new pacemaker placement and closure of PFO. -Started on anticoagulation with warfarin. INR is at 3.5. Pharmacy to adjust Coumadin dose.  #4 depression-on Zoloft and trazodone.  #5 DVT prophylaxis-on warfarin  #6 elevated TSH-check T4 Elevated LFTs-could be from metastases. Follow-up tomorrow  Encourage ambulation   All the records are reviewed and case discussed with Care Management/Social Workerr. Management plans discussed with the patient, family and they are in agreement.  CODE STATUS: Full Code  TOTAL TIME TAKING CARE OF THIS PATIENT: 37 minutes.   POSSIBLE D/C IN 1-2 DAYS, DEPENDING ON CLINICAL CONDITION.   Gladstone Lighter M.D on 11/17/2015 at 12:12 PM  Between 7am to 6pm - Pager - [phone]  After 6pm go to [email or web address] - password [email or web address] [facility]  Office  [phone]  CC: Primary care physician; Enid Derry, MD

## 2015-11-17 NOTE — Progress Notes (Signed)
ANTICOAGULATION CONSULT NOTE - Initial Consult  Pharmacy Consult for warfarin dosing Indication: RV thrombus  Allergies  Allergen Reactions  . Hydrocodone Nausea And Vomiting    Patient Measurements: Height: 5\' 1"  (154.9 cm) Weight: 117 lb 11.6 oz (53.4 kg) IBW/kg (Calculated) : 47.8 Heparin Dosing Weight: n/a  Vital Signs: Temp: 101.8 F (38.8 C) (09/07 0833) Temp Source: Oral (09/07 0833) BP: 115/73 (09/07 0833) Pulse Rate: 107 (09/07 0833)  Labs:  Recent Labs  11/15/15 1011 11/16/15 1357  11/16/15 1503 11/16/15 2055 11/17/15 0343  HGB  --   --   < > 9.9* 10.4* 9.8*  HCT  --   --   --  31.0* 30.9* 28.8*  PLT  --   --   --  346 345 341  LABPROT  --   --   --   --   --  36.7*  INR 3.0 4.9  --   --   --  3.59  CREATININE  --   --   --  1.29* 1.15* 1.28*  < > = values in this interval not displayed.  Estimated Creatinine Clearance: 37 mL/min (by C-G formula based on SCr of 1.28 mg/dL).   Medical History: Past Medical History:  Diagnosis Date  . Allergic rhinitis   . Carcinoma, renal cell ([place]) 06/02/2015  . 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 CHB (complete heart block) ([place]) 11/05/2015  . Depression   . GERD (gastroesophageal reflux disease)    pt states was never dx with GERD  . History of MRSA infection   . Hyperlipidemia   . Insomnia   . Metastasis to liver ([place]) 06/02/2015  . Metastatic cancer to lung ([place]) 06/02/2015  . Monitoring for anticoagulant use 11/15/2015   INR 2-3  . OA (osteoarthritis)   . Osteopenia Aug 2015   femoral neck T score -1.1  . 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 Because of the presence of atypical antibodies, please allow 4 hours for the Transfusion Service to find compatible blood for this patient.  . 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 S/P patent foramen ovale closure 11/05/2015  . S/P placement of  cardiac pacemaker 11/10/2015   Overview:  11/09/15  . Vitamin D deficiency disease     Medications:  Home dose of warfarin is 3mg  daily per pt.  Dose has been 3mg  daily since at least 9/3 when pt was d/c from hospital. Pt had been receiving 4mg  on the 28,29,30 per [facility] notes, however cannot find record of the doses on the 31,1,2.   Assessment: 9/6 INR 4.9 9/7 INR 3.59  Goal of Therapy:  INR 2-3    Plan:  INR still supratherapeutic. Continue to hold warfarin. Recheck INR in the AM.  Lidwina Kaner D Tahmir Kleckner 11/17/2015,8:41 AM

## 2015-11-18 LAB — COMPREHENSIVE METABOLIC PANEL
ALT: 129 U/L — ABNORMAL HIGH (ref 14–54)
ANION GAP: 9 (ref 5–15)
AST: 126 U/L — ABNORMAL HIGH (ref 15–41)
Albumin: 2.8 g/dL — ABNORMAL LOW (ref 3.5–5.0)
Alkaline Phosphatase: 1106 U/L — ABNORMAL HIGH (ref 38–126)
BILIRUBIN TOTAL: 0.8 mg/dL (ref 0.3–1.2)
BUN: 23 mg/dL — ABNORMAL HIGH (ref 6–20)
CHLORIDE: 103 mmol/L (ref 101–111)
CO2: 23 mmol/L (ref 22–32)
Calcium: 8.8 mg/dL — ABNORMAL LOW (ref 8.9–10.3)
Creatinine, Ser: 1.09 mg/dL — ABNORMAL HIGH (ref 0.44–1.00)
GFR, EST NON AFRICAN AMERICAN: 56 mL/min — AB (ref >60)
Glucose, Bld: 109 mg/dL — ABNORMAL HIGH (ref 65–99)
POTASSIUM: 4 mmol/L (ref 3.5–5.1)
Sodium: 135 mmol/L (ref 135–145)
TOTAL PROTEIN: 7.1 g/dL (ref 6.5–8.1)

## 2015-11-18 LAB — CBC
HEMATOCRIT: 28.9 % — AB (ref 35.0–47.0)
Hemoglobin: 9.8 g/dL — ABNORMAL LOW (ref 12.0–16.0)
MCH: 30.4 pg (ref 26.0–34.0)
MCHC: 33.8 g/dL (ref 32.0–36.0)
MCV: 89.8 fL (ref 80.0–100.0)
PLATELETS: 335 10*3/uL (ref 150–440)
RBC: 3.22 MIL/uL — ABNORMAL LOW (ref 3.80–5.20)
RDW: 17 % — AB (ref 11.5–14.5)
WBC: 8.4 10*3/uL (ref 3.6–11.0)

## 2015-11-18 LAB — PROTIME-INR
INR: 1.41
INR: 1.52
PROTHROMBIN TIME: 17.4 s — AB (ref 11.4–15.2)
Prothrombin Time: 18.5 seconds — ABNORMAL HIGH (ref 11.4–15.2)

## 2015-11-18 LAB — MRSA PCR SCREENING: MRSA by PCR: NEGATIVE

## 2015-11-18 MED ORDER — WARFARIN SODIUM 1 MG PO TABS
3.0000 mg | ORAL_TABLET | Freq: Once | ORAL | Status: AC
  Administered 2015-11-18: 3 mg via ORAL
  Filled 2015-11-18: qty 3

## 2015-11-18 MED ORDER — POLYETHYLENE GLYCOL 3350 17 G PO PACK: 17.0000 g | PACK | Freq: Every day | ORAL | Status: DC | PRN

## 2015-11-18 MED ORDER — SENNOSIDES-DOCUSATE SODIUM 8.6-50 MG PO TABS
1.0000 | ORAL_TABLET | Freq: Two times a day (BID) | ORAL | Status: DC
  Administered 2015-11-18 – 2015-11-19 (×3): 1 via ORAL
  Filled 2015-11-18 (×3): qty 1

## 2015-11-18 MED ORDER — WARFARIN - PHARMACIST DOSING INPATIENT: Freq: Every day | Status: DC

## 2015-11-18 MED ORDER — ENOXAPARIN SODIUM 60 MG/0.6ML ~~LOC~~ SOLN
55.0000 mg | Freq: Two times a day (BID) | SUBCUTANEOUS | Status: DC
  Administered 2015-11-18 – 2015-11-19 (×3): 55 mg via SUBCUTANEOUS
  Filled 2015-11-18 (×3): qty 0.6

## 2015-11-18 MED ORDER — CYCLOBENZAPRINE HCL 5 MG PO TABS
7.5000 mg | ORAL_TABLET | Freq: Three times a day (TID) | ORAL | Status: DC | PRN
  Administered 2015-11-18 – 2015-11-19 (×2): 7.5 mg via ORAL
  Filled 2015-11-18 (×3): qty 1.5

## 2015-11-18 NOTE — Progress Notes (Addendum)
[place] [facility] [place] NAME: Tracy Mullins    MR#:  [id number]  DATE OF BIRTH:  07-07-1960  SUBJECTIVE:  CHIEF COMPLAINT:   Chief Complaint  Patient presents with  . Shortness of Breath   - feels better, improving dyspnea, off o2 - fevers have improved as well  REVIEW OF SYSTEMS:  Review of Systems  Constitutional: Positive for malaise/fatigue. Negative for chills and fever.  HENT: Negative for ear discharge, ear pain and nosebleeds.   Respiratory: Positive for cough and shortness of breath. Negative for wheezing.   Cardiovascular: Negative for chest pain, palpitations and leg swelling.  Gastrointestinal: Negative for abdominal pain, constipation, diarrhea, nausea and vomiting.  Genitourinary: Negative for dysuria.  Musculoskeletal: Negative for back pain, myalgias and neck pain.  Skin: Negative for rash.  Neurological: Negative for dizziness, sensory change, speech change, focal weakness, seizures and headaches.  Psychiatric/Behavioral: Negative for depression.    DRUG ALLERGIES:   Allergies  Allergen Reactions  . Hydrocodone Nausea And Vomiting    VITALS:  Blood pressure (!) 93/43, pulse 96, temperature 98.5 F (36.9 C), temperature source Oral, resp. rate (!) 21, height 5' 1" (1.549 m), weight 54.7 kg (120 lb 8 oz), SpO2 94 %.  PHYSICAL EXAMINATION:  Physical Exam  GENERAL:  55 y.o.-year-old patient lying in the bed with no acute distress.  EYES: Pupils equal, round, reactive to light and accommodation. No scleral icterus. Extraocular muscles intact.  HEENT: Head atraumatic, normocephalic. Oropharynx and nasopharynx clear.  NECK:  Supple, no jugular venous distention. No thyroid enlargement, no tenderness.  LUNGS: Normal breath sounds bilaterally except right base, no wheezing, rales,rhonchi or crepitation. No use of accessory muscles of respiration. Decreased right basilar breath sounds CARDIOVASCULAR: Healing mid sternal  incision. S1, S2 normal. No rubs, or gallops. 3/6 loud systolic murmur ABDOMEN: Soft, nontender, nondistended. Bowel sounds present. No organomegaly or mass.  EXTREMITIES: No pedal edema, cyanosis, or clubbing.  NEUROLOGIC: Cranial nerves II through XII are intact. Muscle strength 5/5 in all extremities. Sensation intact. Gait not checked.  PSYCHIATRIC: The patient is alert and oriented x 3.  SKIN: No obvious rash, lesion, or ulcer.    LABORATORY PANEL:   CBC  Recent Labs Lab 11/18/15 0347  WBC 8.4  HGB 9.8*  HCT 28.9*  PLT 335   ------------------------------------------------------------------------------------------------------------------  Chemistries   Recent Labs Lab 11/18/15 0347  NA 135  K 4.0  CL 103  CO2 23  GLUCOSE 109*  BUN 23*  CREATININE 1.09*  CALCIUM 8.8*  AST 126*  ALT 129*  ALKPHOS 1,106*  BILITOT 0.8   ------------------------------------------------------------------------------------------------------------------  Cardiac Enzymes No results for input(s): TROPONINI in the last 168 hours. ------------------------------------------------------------------------------------------------------------------  RADIOLOGY:  Dg Chest 2 View  Result Date: 11/16/2015 CLINICAL DATA:  Open heart surgery for right ventricular tumor and mural thrombus removal as well as pacemaker placement left week. Patient reports cough and fever to 101.1 degrees last night. History of lung and liver malignancy. EXAM: CHEST  2 VIEW COMPARISON:  PA and lateral chest x-ray of May 10, 2014 FINDINGS: The lungs are mildly hyperinflated. There is infiltrate in the right lower lobe posterior laterally. There is a small right pleural effusion. The left lung is well-expanded. The interstitial markings of both lungs are increased. The cardiac silhouette is mildly enlarged. The circular pattern of radiodense markers is present and may be in the tricuspid position. The pulmonary  vascularity is not clearly engorged. The sternal wires are intact.  There is mild soft tissue prominence of the retrosternal region. The pacemaker leads are in reasonable position. There is calcification in the wall of the aortic arch. There is mild multilevel degenerative disc disease of the thoracic spine. IMPRESSION: Acute right lower lobe pneumonia with small right pleural effusion. Mild pulmonary interstitial edema. Mild cardiomegaly without significant pulmonary vascular congestion. Post median sternotomy changes with small amount of fluid in the retrosternal region. Followup PA and lateral chest X-ray is recommended in 3-4 weeks following trial of antibiotic therapy to ensure resolution and exclude underlying malignancy. Electronically Signed   By: David  Martinique M.D.   On: 11/16/2015 16:41    EKG:  No orders found for this or any previous visit.  ASSESSMENT AND PLAN:   55 y/o Female with past medical history significant for stage IV renal cell carcinoma diagnosed in January 2017 status post left nephrectomy, metastatic cystoscopy part, pericardium, lung and liver who was recently at [facility] for cardiac surgery with tricuspid valve replacement, resection of mural thrombus, status post pacemaker for complete heart block, PFO closure, arthritis presents to hospital secondary to fevers and noted to have pneumonia.  #1 healthcare acquired pneumonia-recent prolonged hospitalization at [facility] and just got discharged last week. -Chest x-ray with right lower lobe infiltrate. Negative blood cultures. Discontinue vancomycin, MRSA pcr negative - fevers resolved.. Continue cefepime. - f/u CXR today  #2 stage IV renal cell carcinoma with metastasis to the heart, lungs, liver-following at [facility]. Has an oncology follow-up.  -Receiving oral chemotherapy by mouth. We'll be restarted after seen by oncology. 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 -Also had complete heart block status post new pacemaker placement and closure of PFO. -Started on anticoagulation with warfarin. Pharmacy to adjust Coumadin dose.  #4 depression-on Zoloft and trazodone.  #5 DVT prophylaxis-on warfarin- pharmacy to adjust dose  #6 elevated TSH-likely  Sick euthyroid syndrome, normal T4 Elevated LFTs-could be from metastases in liver, alk phos significantly elevated-? Bone mets. Last bone scan from march 2017 with no bone mets- trying to reach her oncologist Dr. Roosevelt Locks from [facility]   All the records are reviewed and case discussed with Care Management/Social Workerr. Management plans discussed with the patient, family and they are in agreement.  CODE STATUS: Full Code  TOTAL TIME TAKING CARE OF THIS PATIENT: 37 minutes.   POSSIBLE D/C TOMORROW, DEPENDING ON CLINICAL CONDITION.   Gladstone Lighter M.D on 11/18/2015 at 1:04 PM  Between 7am to 6pm - Pager - [phone]  After 6pm go to [email or web address] - password [email or web address] [facility]  Office  [phone]  CC: Primary care physician; Enid Derry, MD

## 2015-11-18 NOTE — Progress Notes (Signed)
ANTICOAGULATION CONSULT NOTE - Initial Consult  Pharmacy Consult for warfarin dosing Indication: RV thrombus  Allergies  Allergen Reactions  . Hydrocodone Nausea And Vomiting    Patient Measurements: Height: 5\' 1"  (154.9 cm) Weight: 120 lb 8 oz (54.7 kg) IBW/kg (Calculated) : 47.8 Heparin Dosing Weight: n/a  Vital Signs: Temp: 98.5 F (36.9 C) (09/08 1158) Temp Source: Oral (09/08 1158) BP: 93/43 (09/08 1158) Pulse Rate: 96 (09/08 1158)  Labs:  Recent Labs  11/16/15 2055 11/17/15 0343 11/18/15 0347 11/18/15 0950 11/18/15 1402  HGB 10.4* 9.8* 9.8*  --   --   HCT 30.9* 28.8* 28.9*  --   --   PLT 345 341 335  --   --   LABPROT  --  36.7*  --  17.4* 18.5*  INR  --  3.59  --  1.41 1.52  CREATININE 1.15* 1.28* 1.09*  --   --     Estimated Creatinine Clearance: 43.5 mL/min (by C-G formula based on SCr of 1.09 mg/dL).   Medical History: Past Medical History:  Diagnosis Date  . Allergic rhinitis   . Carcinoma, renal cell ([place]) 06/02/2015  . 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 CHB (complete heart block) ([place]) 11/05/2015  . Depression   . GERD (gastroesophageal reflux disease)    pt states was never dx with GERD  . History of MRSA infection   . Hyperlipidemia   . Insomnia   . Metastasis to liver ([place]) 06/02/2015  . Metastatic cancer to lung ([place]) 06/02/2015  . Monitoring for anticoagulant use 11/15/2015   INR 2-3  . OA (osteoarthritis)   . Osteopenia Aug 2015   femoral neck T score -1.1  . 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 Because of the presence of atypical antibodies, please allow 4 hours for the Transfusion Service to find compatible blood for this patient.  . 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 S/P patent foramen ovale closure 11/05/2015  . S/P placement of cardiac pacemaker 11/10/2015   Overview:  11/09/15  . Vitamin D deficiency  disease     Medications:  Home dose of warfarin is 3mg  daily per pt.  Dose has been 3mg  daily since at least 9/3 when pt was d/c from hospital. Pt had been receiving 4mg  on the 28,29,30 per [facility] notes, however cannot find record of the doses on the 31,1,2.   Assessment: 9/6 INR 4.9 9/7 INR 3.59 9/8 INR 1.41, repeat 1.52  Goal of Therapy:  INR 2-3    Plan:  INR dropped from 3.59 yesterday to ~ 1.5 today. Repeated lab to confirm results. Subtherapeutic. Discussed with Dr. Tressia Miners, will cover with lovenox at this time. Warfarin 3mg  PO x 1 today Enoxaparin 55mg  SQ Q12H Repeat INR with AM labs.   Rexene Edison, PharmD Clinical Pharmacist  11/18/2015 3:15 PM

## 2015-11-18 NOTE — Plan of Care (Signed)
Problem: Bowel/Gastric: Goal: Will not experience complications related to bowel motility Outcome: Progressing Pt has remained free of falls/injury this shift. Pt has discussed her plan of care upon discharge with Doctor on rounds this am, anticipated d/c for tomorrow.

## 2015-11-18 NOTE — Progress Notes (Signed)
Shift assessment completed at 0815. Pt is awake, alert and oriented, in no distress. Pt has  Midline incision to her chest that is open to air, approximated and dry. L chest wall with steri strips intact and site is dry. Pt is on room air, fine crackles heard to R lung base.Respirations are unlabored. Monitor in place, pt is v paced at times. Abdomen is soft, bs heard. PIV #22 intact to R fa, site is free of redness and swelling. Pt is oob to void prn, ppp, no edema noted. Since assessment, Dr. Derek Mound has rounded on pt, discussed further plan of care with possible d/c home tomorrow, and discussed structure of pt's outpatient follow up. Pt stated she is using respirex and she felt this helped with her sob. Dr. Raliegh Ip gave pt new goal with this of 1000. PCR for MRSA accomplished and sent to lab per order. Srx2, call bell in reach.

## 2015-11-18 NOTE — Progress Notes (Signed)
Pharmacy Antibiotic Note  Tracy Mullins is a 55 y.o. female admitted on 11/16/2015 with pneumonia.  Pharmacy has been consulted for cefepime dosing.  Plan: MRSA PCR negative, vancomycin discontineud  Continue cefepime 2 grams q 12 hours  Height: 5\' 1"  (154.9 cm) Weight: 120 lb 8 oz (54.7 kg) IBW/kg (Calculated) : 47.8  Temp (24hrs), Avg:98.6 F (37 C), Min:97.7 F (36.5 C), Max:99.2 F (37.3 C)   Recent Labs Lab 11/16/15 1503 11/16/15 2055 11/17/15 0343 11/18/15 0347  WBC 10.3 9.1 6.9 8.4  CREATININE 1.29* 1.15* 1.28* 1.09*  LATICACIDVEN  --  1.2  --   --     Estimated Creatinine Clearance: 43.5 mL/min (by C-G formula based on SCr of 1.09 mg/dL).    Allergies  Allergen Reactions  . Hydrocodone Nausea And Vomiting    Antimicrobials this admission: vancomycin  9/6 >> 9/8 cefepime  9/7 >>   Dose adjustments this admission:   Microbiology results: 9/6 BCx: NGTD 9/6 Sputum: pending    9/6 CXR: RLL pneumonia  Thank you for allowing pharmacy to be a part of this patient's care.  Emagene Merfeld C 11/18/2015 3:19 PM

## 2015-11-19 ENCOUNTER — Inpatient Hospital Stay: Payer: Commercial Managed Care - HMO

## 2015-11-19 LAB — PROTIME-INR
INR: 1.42
Prothrombin Time: 17.5 seconds — ABNORMAL HIGH (ref 11.4–15.2)

## 2015-11-19 MED ORDER — ENOXAPARIN SODIUM 60 MG/0.6ML ~~LOC~~ SOLN: 60.0000 mg | Freq: Two times a day (BID) | SUBCUTANEOUS | 0 refills | Status: AC

## 2015-11-19 MED ORDER — CEFUROXIME AXETIL 500 MG PO TABS: 500.0000 mg | ORAL_TABLET | Freq: Two times a day (BID) | ORAL | 0 refills | Status: AC

## 2015-11-19 MED ORDER — GUAIFENESIN-DM 100-10 MG/5ML PO SYRP: 5.0000 mL | ORAL_SOLUTION | ORAL | 0 refills | Status: AC | PRN

## 2015-11-19 MED ORDER — WARFARIN SODIUM 4 MG PO TABS: 4.0000 mg | ORAL_TABLET | Freq: Once | ORAL | Status: DC

## 2015-11-19 MED ORDER — WARFARIN SODIUM 4 MG PO TABS
4.0000 mg | ORAL_TABLET | Freq: Every evening | ORAL | 2 refills | Status: AC
Start: 2015-11-19 — End: ?

## 2015-11-19 MED ORDER — CYCLOBENZAPRINE HCL 7.5 MG PO TABS: 7.5000 mg | ORAL_TABLET | Freq: Three times a day (TID) | ORAL | 0 refills | Status: AC | PRN

## 2015-11-19 NOTE — Care Management Note (Signed)
Case Management Note  Patient Details  Name: ROYE ROTMAN MRN: [id number] Date of Birth: [birth year]  Subjective/Objective:   Discussed discharge planning with Ms Mickiewicz. She chose [facility] and a referral for home health PT and RN was faxed to [facility] including instructions for home health RN lab draws.                 Action/Plan:   Expected Discharge Date:                  Expected Discharge Plan:     In-House Referral:     Discharge planning Services     Post Acute Care Choice:    Choice offered to:     DME Arranged:    DME Agency:     HH Arranged:    HH Agency:     Status of Service:     If discussed at [employer] of Stay Meetings, dates discussed:    Additional Comments:  Stephene Alegria A, RN 11/19/2015, 1:44 PM

## 2015-11-19 NOTE — Progress Notes (Signed)
Shift assessment completed by 0800. Pt cited pain to her R flank with deep breath, no pain otherwise. Pt is alert and oriented, temp of 99.2, pt accepted tylenol po, stated she was starting to feel bad, pt also wrapped up in blankets. Pt is on room air, respirations are shallow, lungs are clear bilat. Monitor in place, v pacing noted. Pt has steri strips intact to l chest wall, site is free of redness and swelling. Pt also has a midsternal incison that is dry and approximated, also free of redness and swelling. Abdomen is soft, bs heard. Pt denied difficulty voiding, is oob to bathroom independently. PPP, no edema ntoed. PIV#20 intact to R fa, site free of redness and swelling, also no drainage noted to any incision sites. After assessment, pt left the unit for chest x ray via her bed and returned. Dr. Tressia Miners rounded on pt at 0905, discussed chest x ray results needed prior to decision about discharge today. Srx2, call bell in reach.

## 2015-11-19 NOTE — Progress Notes (Signed)
PIV removed by this writer in anticipation of pt discharge. Catheter intact, pt tolerated well. Pt in agreement to receiving lovenox teaching and giving herself the next dose prior to d/c.

## 2015-11-19 NOTE — Progress Notes (Signed)
Physical Therapy Evaluation Patient Details Name: Tracy Mullins MRN: [id number] DOB: 12-03-1960 Today's Date: 11/19/2015   History of Present Illness  Tracy Mullins  is a 55 y.o. female who presents with Increasing shortness of breath. Patient was recently at [facility] where she had heart surgery done to remove a mass from her heart. She went to see her primary care physician as part of her routine follow-up, and mentioned to him that she had some shortness of breath. He got a chest x-ray which is very worrisome for pneumonia. He sent her to the ED, workup here showed the same. Hospitals were called for admission and treatment.  Clinical Impression  The patient is independent with bed mobility, transfers and ambulation without AD for 300 feet. She is able to ascend/descend 6 steps with right hand rail independently. She has no reports of pain and has no skilled PT needs at this time.     Follow Up Recommendations No PT follow up    Equipment Recommendations       Recommendations for Other Services       Precautions / Restrictions Precautions Precautions: None Restrictions Weight Bearing Restrictions: No      Mobility  Bed Mobility Overal bed mobility: Independent                Transfers Overall transfer level: Independent                  Ambulation/Gait Ambulation/Gait assistance: Independent Ambulation Distance (Feet): 300 Feet Assistive device: None Gait Pattern/deviations: Step-through pattern   Gait velocity interpretation: >2.62 ft/sec, indicative of independent community ambulator    Stairs Stairs: Yes Stairs assistance: Independent Stair Management: One rail Right Number of Stairs: 6    Wheelchair Mobility    Modified Rankin (Stroke Patients Only)       Balance Overall balance assessment: Independent                                           Pertinent Vitals/Pain Pain Assessment: No/denies pain    Home Living  Family/patient expects to be discharged to:: Private residence Living Arrangements: Alone Available Help at Discharge: Family Type of Home: Mobile home Home Access: Stairs to enter Entrance Stairs-Rails: Right Entrance Stairs-Number of Steps: 5 Home Layout: One level Home Equipment: None      Prior Function Level of Independence: Independent               Hand Dominance        Extremity/Trunk Assessment   Upper Extremity Assessment: Overall WFL for tasks assessed           Lower Extremity Assessment: Overall WFL for tasks assessed         Communication   Communication: No difficulties  Cognition Arousal/Alertness: Awake/alert Behavior During Therapy: WFL for tasks assessed/performed Overall Cognitive Status: Within Functional Limits for tasks assessed                      General Comments      Exercises        Assessment/Plan    PT Assessment Patent does not need any further PT services  PT Diagnosis Difficulty walking   PT Problem List    PT Treatment Interventions     PT Goals (Current goals can be found in the Care Plan section) Acute Rehab PT Goals Patient Stated  Goal: to go home PT Goal Formulation: With patient Time For Goal Achievement: 11/19/15 Potential to Achieve Goals: Good    Frequency     Barriers to discharge        Co-evaluation               End of Session   Activity Tolerance: Patient tolerated treatment well Patient left: in bed           Time: 1140-1200 PT Time Calculation (min) (ACUTE ONLY): 20 min   Charges:   PT Evaluation $PT Eval Low Complexity: 1 Procedure     PT G CodesArelia Sneddon S 11/19/2015, 12:08 PM

## 2015-11-19 NOTE — Progress Notes (Signed)
Pt's family members arrived at approx 1330. Plan for discharge is that pt will go to her daughter's home for two weeks in case pt needs care. This Probation officer reviewed d/c instructions with pt and daughter, both verbalized understanding. Lovenox teaching was done with patient and her daughter, and pt received a lovenox teaching kit to take home. Pt also received script for lovenox with directions to fill. During d/c, home care called pt and received address that pt will be staying at to initiate visits. Pt was escorted to front of facility and waiting family care via [place].

## 2015-11-19 NOTE — Plan of Care (Signed)
Problem: Bowel/Gastric: Goal: Will not experience complications related to bowel motility Outcome: Completed/Met Date Met: 11/19/15 Pt is discharged, has remained free of falls/injury this shift. Pt is aware of outpatient plan for follow up.

## 2015-11-19 NOTE — Progress Notes (Addendum)
ANTICOAGULATION CONSULT NOTE - Initial Consult  Pharmacy Consult for warfarin dosing Indication: RV thrombus  Allergies  Allergen Reactions  . Hydrocodone Nausea And Vomiting    Patient Measurements: Height: 5\' 1"  (154.9 cm) Weight: 116 lb 3.2 oz (52.7 kg) IBW/kg (Calculated) : 47.8 Heparin Dosing Weight: n/a  Vital Signs: Temp: 99.3 F (37.4 C) (09/09 0726) Temp Source: Oral (09/09 0726) BP: 107/64 (09/09 0726) Pulse Rate: 102 (09/09 0726)  Labs:  Recent Labs  11/16/15 2055  11/17/15 0343 11/18/15 0347 11/18/15 0950 11/18/15 1402 11/19/15 0429  HGB 10.4*  --  9.8* 9.8*  --   --   --   HCT 30.9*  --  28.8* 28.9*  --   --   --   PLT 345  --  341 335  --   --   --   LABPROT  --   < > 36.7*  --  17.4* 18.5* 17.5*  INR  --   < > 3.59  --  1.41 1.52 1.42  CREATININE 1.15*  --  1.28* 1.09*  --   --   --   < > = values in this interval not displayed.  Estimated Creatinine Clearance: 43.5 mL/min (by C-G formula based on SCr of 1.09 mg/dL).   Medical History: Past Medical History:  Diagnosis Date  . Allergic rhinitis   . Carcinoma, renal cell ([place]) 06/02/2015  . 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 CHB (complete heart block) ([place]) 11/05/2015  . Depression   . GERD (gastroesophageal reflux disease)    pt states was never dx with GERD  . History of MRSA infection   . Hyperlipidemia   . Insomnia   . Metastasis to liver ([place]) 06/02/2015  . Metastatic cancer to lung ([place]) 06/02/2015  . Monitoring for anticoagulant use 11/15/2015   INR 2-3  . OA (osteoarthritis)   . Osteopenia Aug 2015   femoral neck T score -1.1  . 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 Because of the presence of atypical antibodies, please allow 4 hours for the Transfusion Service to find compatible blood for this patient.  . 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 S/P patent  foramen ovale closure 11/05/2015  . S/P placement of cardiac pacemaker 11/10/2015   Overview:  11/09/15  . Vitamin D deficiency disease     Medications:  Home dose of warfarin is 3mg  daily per pt.  Dose has been 3mg  daily since at least 9/3 when pt was d/c from hospital. Pt had been receiving 4mg  on the 28,29,30 per [facility] notes, however cannot find record of the doses on the 31,1,2.   Assessment: 9/6 INR 4.9 9/7 INR 3.59 9/8 INR 1.41, repeat 1.52 - Warfarin 3mg  9/9 INR 1.42  Goal of Therapy:  INR 2-3    Plan:  INR remains subtherapeutic at 1.42  Warfarin 4mg  PO x 1 today, continue to cover with lovenox Enoxaparin 55mg  SQ Q12H Repeat INR with AM labs.   If INR remains subtherapeutic at discharge, recommend continuation of lovenox and close follow up with INR clinic, especially if discharged on antibiotics.    Rexene Edison, PharmD Clinical Pharmacist  11/19/2015 9:03 AM

## 2015-11-20 NOTE — Assessment & Plan Note (Signed)
Managed with oxycodone; patient did not need refill when asked today; pain control adequate

## 2015-11-20 NOTE — Assessment & Plan Note (Signed)
INR shows supratherapeutic level today; hold Coumadin today; I respectfully requested the [facility] cardiologist manage her INR, given her complex case and recent significant surgery; I had spoken to the [facility] yesterday about asking them to manage her INR and they politely declined

## 2015-11-20 NOTE — Assessment & Plan Note (Signed)
Check CXR; reviewed prior CXR reports from recent hospitalization

## 2015-11-20 NOTE — Assessment & Plan Note (Signed)
Active, with multiple metastases; continue to f/u with cancer doctor

## 2015-11-20 NOTE — Assessment & Plan Note (Signed)
Renal cell with mets to the liver; recheck liver function tests

## 2015-11-20 NOTE — Assessment & Plan Note (Signed)
Most likely from mets to liver and bone; recheck level today; f/u with cancer specialist

## 2015-11-21 LAB — CULTURE, BLOOD (ROUTINE X 2)
CULTURE: NO GROWTH
CULTURE: NO GROWTH

## 2015-11-21 NOTE — Discharge Summary (Signed)
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 Allergic rhinitis   . Carcinoma, renal cell ([place]) 06/02/2015  . 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 CHB (complete heart block) ([place]) 11/05/2015  . Depression   . GERD (gastroesophageal reflux disease)    pt states was never dx with GERD  . History of MRSA infection   . Hyperlipidemia   . Insomnia   . Metastasis to liver ([place]) 06/02/2015  . Metastatic cancer to lung ([place]) 06/02/2015  . Monitoring for anticoagulant use 11/15/2015   INR 2-3  . OA (osteoarthritis)   . Osteopenia Aug 2015   femoral neck T score -1.1  . 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 Because of the presence of atypical antibodies, please allow 4 hours for the Transfusion Service to find compatible blood for this patient.  . 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 S/P patent foramen ovale closure 11/05/2015  . S/P placement of cardiac pacemaker 11/10/2015   Overview:  11/09/15  . Vitamin D deficiency disease     HOSPITAL COURSE:   55 y/o Female with past medical history significant for stage IV renal cell carcinoma diagnosed in January 2017 status post left nephrectomy, metastatic  cystoscopy part, pericardium, lung and liver who was recently at [facility] for cardiac surgery with tricuspid valve replacement, resection of mural thrombus, status post pacemaker for complete heart block, PFO closure, arthritis presents to hospital secondary to fevers and noted to have pneumonia.  #1 healthcare acquired pneumonia-recent prolonged hospitalization at [facility] and just got discharged last week. -Chest x-ray with right lower lobe infiltrate. Negative blood cultures.  -Received vancomycin and cefepime in the hospital.-Repeat chest x-ray with no worsening and minimal right basilar atelectasis. -Being discharged on Ceftin  #2 stage IV renal cell carcinoma with metastasis to the heart, lungs, liver-following at [facility]. Has an oncology follow-up.  -Might need repeat bone scan is initial bone scan from March 2017 has been negative but now increasing alkaline phosphatase and patient complaining of multiple bony pains -Discussed with oncology nurse practitioner from [place] who works with her oncologist, recommended sooner follow-up after discharge. -Was Receiving oral chemotherapy by mouth. - elevated lfts and alk phos  #3 cardiac tumor-metastasis from her renal cell carcinoma, patient had mural thrombus and tricuspid valve involvement-status post surgery, tricuspid valve replacement done. -Also had complete heart block status post new pacemaker placement and closure of PFO. -Started on anticoagulation with warfarin. Pharmacy to adjust Coumadin dose. -Since INR was low, being bridged with Lovenox. He'll be discharged with home health.  #4 depression-on Zoloft and trazodone.  #5 elevated TSH-likely  Sick euthyroid syndrome, normal T4  Being discharged home today. Home health ordered.   DISCHARGE CONDITIONS:  Guarded CONSULTS OBTAINED:    None  DRUG ALLERGIES:   Allergies  Allergen Reactions  . Hydrocodone Nausea And Vomiting   DISCHARGE MEDICATIONS:     Medication List     TAKE these medications   acetaminophen 325 MG tablet Commonly known as:  TYLENOL Take 2 tablets by mouth every 6 (six) hours as needed.   aspirin 81 MG chewable tablet Chew 1 tablet (81 mg total) by mouth daily.   cefUROXime 500 MG tablet Commonly known as:  CEFTIN Take 1 tablet (500 mg total) by mouth 2 (two) times daily with a meal. X 5 more days   cyclobenzaprine 7.5 MG tablet Commonly known as:  FEXMID Take 1 tablet (7.5 mg total) by mouth 3 (three) times daily as needed for muscle spasms.   enoxaparin 60 MG/0.6ML injection Commonly known as:  LOVENOX Inject 0.6 mLs (60 mg total) into the skin every 12 (twelve) hours. X 5 days for bridgning   furosemide 40 MG tablet Commonly known as:  LASIX Take 1 tablet by mouth daily.   gabapentin 100 MG capsule Commonly known as:  NEURONTIN Take 1 capsule by mouth 3 (three) times daily.   guaiFENesin-dextromethorphan 100-10 MG/5ML syrup Commonly known as:  ROBITUSSIN DM Take 5 mLs by mouth every 4 (four) hours as needed for cough.   oxyCODONE 5 MG immediate release tablet Commonly known as:  Oxy IR/ROXICODONE Take 1 tablet by mouth every 6 (six) hours as needed.   polyethylene glycol packet Commonly known as:  MIRALAX / GLYCOLAX Take 1 packet by mouth daily as needed.   potassium chloride SA 20 MEQ tablet Commonly known as:  K-DUR,KLOR-CON Take 1 tablet by mouth daily.   sertraline 50 MG tablet Commonly known as:  ZOLOFT Take 1 tablet (50 mg total) by mouth daily.   traZODone 100 MG tablet Commonly known as:  DESYREL Take 1 tablet (100 mg total) by mouth at bedtime.   warfarin 4 MG tablet Commonly known as:  COUMADIN Take 1 tablet (4 mg total) by mouth every evening. INR check in 2 days What changed:  medication strength  how much to take  additional instructions        DISCHARGE INSTRUCTIONS:   1. Oncology follow-up in 3-4 days 2. Follow-up with cardiothoracic surgeon in 3-4 days 3. INR check in 2-3  days 4. PCP follow-up in 1-2 weeks  DIET:   Cardiac diet  ACTIVITY:   Activity as tolerated  OXYGEN:   Home Oxygen: No.  Oxygen Delivery: room air  DISCHARGE LOCATION:   home   If you experience worsening of your admission symptoms, develop shortness of breath, life threatening emergency, suicidal or homicidal thoughts you must seek medical attention immediately by calling 911 or calling your MD immediately  if symptoms less severe.  You Must read complete instructions/literature along with all the possible adverse reactions/side effects for all the Medicines you take and that have been prescribed to you. Take any new Medicines after you have completely understood and accpet all the possible adverse reactions/side effects.   Please note  You were cared for by a hospitalist during your hospital stay. If you have any questions about your discharge medications or the care you received while you were in the hospital after you are discharged, you can call the unit and asked to speak with the hospitalist on call if the hospitalist that took care of you is not available. Once you are discharged, your primary care physician will handle any further  medical issues. Please note that NO REFILLS for any discharge medications will be authorized once you are discharged, as it is imperative that you return to your primary care physician (or establish a relationship with a primary care physician if you do not have one) for your aftercare needs so that they can reassess your need for medications and monitor your lab values.    On the day of Discharge:  VITAL SIGNS:   Blood pressure 93/63, pulse (!) 102, temperature 98 F (36.7 C), temperature source Oral, resp. rate 16, height 5' 1"  (1.549 m), weight 52.7 kg (116 lb 3.2 oz), SpO2 98 %.  PHYSICAL EXAMINATION:    GENERAL:  55 y.o.-year-old patient lying in the bed with no acute distress.  EYES: Pupils equal, round, reactive to light and  accommodation. No scleral icterus. Extraocular muscles intact.  HEENT: Head atraumatic, normocephalic. Oropharynx and nasopharynx clear.  NECK:  Supple, no jugular venous distention. No thyroid enlargement, no tenderness.  LUNGS: Normal breath sounds bilaterally except right base, no wheezing, rales,rhonchi or crepitation. No use of accessory muscles of respiration. Decreased right basilar breath sounds CARDIOVASCULAR: Healing mid sternal incision. S1, S2 normal. No rubs, or gallops. 3/6 loud systolic murmur ABDOMEN: Soft, nontender, nondistended. Bowel sounds present. No organomegaly or mass.  EXTREMITIES: No pedal edema, cyanosis, or clubbing.  NEUROLOGIC: Cranial nerves II through XII are intact. Muscle strength 5/5 in all extremities. Sensation intact. Gait not checked.  PSYCHIATRIC: The patient is alert and oriented x 3.  SKIN: No obvious rash, lesion, or ulcer.   DATA REVIEW:   CBC  Recent Labs Lab 11/18/15 0347  WBC 8.4  HGB 9.8*  HCT 28.9*  PLT 335    Chemistries   Recent Labs Lab 11/18/15 0347  NA 135  K 4.0  CL 103  CO2 23  GLUCOSE 109*  BUN 23*  CREATININE 1.09*  CALCIUM 8.8*  AST 126*  ALT 129*  ALKPHOS 1,106*  BILITOT 0.8     Microbiology Results  Results for orders placed or performed during the hospital encounter of 11/16/15  Blood culture (routine x 2)     Status: None   Collection Time: 11/16/15  9:36 PM  Result Value Ref Range Status   Specimen Description BLOOD LEFT ARM  Final   Special Requests BOTTLES DRAWN AEROBIC AND ANAEROBIC 6CC  Final   Culture NO GROWTH 5 DAYS  Final   Report Status 11/21/2015 FINAL  Final  Blood culture (routine x 2)     Status: None   Collection Time: 11/16/15  9:36 PM  Result Value Ref Range Status   Specimen Description BLOOD RIGHT WRIST  Final   Special Requests BOTTLES DRAWN AEROBIC AND ANAEROBIC 6CC  Final   Culture NO GROWTH 5 DAYS  Final   Report Status 11/21/2015 FINAL  Final  MRSA PCR Screening      Status: None   Collection Time: 11/18/15  9:30 AM  Result Value Ref Range Status   MRSA by PCR NEGATIVE NEGATIVE Final    Comment:        The GeneXpert MRSA Assay (FDA approved for NASAL specimens only), is one component of a comprehensive MRSA colonization surveillance program. It is not intended to diagnose MRSA infection nor to guide or monitor treatment for MRSA infections.     RADIOLOGY:  No results found.   Management plans discussed with the patient, family and they are in agreement.  CODE STATUS:  Code Status History    Date Active  Date Inactive Code Status Order ID Comments User Context   11/16/2015 10:49 PM 11/19/2015  6:30 PM Full Code [id number]  Lance Coon, MD ED      TOTAL TIME TAKING CARE OF THIS PATIENT: 38 minutes.    Gladstone Lighter M.D on 11/21/2015 at 6:24 PM  Between 7am to 6pm - Pager - [phone]  After 6pm go to [email or web address] - Proofreader  [facility]  Office  [phone]  CC: Primary care physician; Enid Derry, MD   Note: This dictation was prepared with Dragon dictation along with smaller phrase technology. Any transcriptional errors that result from this process are unintentional.

## 2015-11-22 ENCOUNTER — Telehealth: Payer: Self-pay

## 2015-11-22 DIAGNOSIS — Z7901 Long term (current) use of anticoagulants: Principal | ICD-10-CM

## 2015-11-22 DIAGNOSIS — Z5181 Encounter for therapeutic drug level monitoring: Secondary | ICD-10-CM

## 2015-11-22 NOTE — Telephone Encounter (Signed)
I found a phone number and spoke with Charlann Boxer; she will contact the person responsible and let them know to contact Dr. Ubaldo Glassing

## 2015-11-22 NOTE — Telephone Encounter (Signed)
[facility] called with INR results of 1.9

## 2015-11-22 NOTE — Telephone Encounter (Signed)
We do not manage her INR; Dr. Ubaldo Glassing does

## 2015-11-23 ENCOUNTER — Other Ambulatory Visit
Admission: RE | Admit: 2015-11-23 | Discharge: 2015-11-23 | Disposition: A | Discharge status: Home / Self Care | Payer: 59 | Source: Ambulatory Visit | Attending: Urology | Admitting: Urology

## 2015-11-23 DIAGNOSIS — Z7901 Long term (current) use of anticoagulants: Secondary | ICD-10-CM | POA: Insufficient documentation

## 2015-11-23 DIAGNOSIS — Z79899 Other long term (current) drug therapy: Secondary | ICD-10-CM | POA: Diagnosis not present

## 2015-11-23 DIAGNOSIS — C649 Malignant neoplasm of unspecified kidney, except renal pelvis: Secondary | ICD-10-CM | POA: Insufficient documentation

## 2015-11-23 LAB — PROTIME-INR
INR: 1.71
Prothrombin Time: 20.3 seconds — ABNORMAL HIGH (ref 11.4–15.2)

## 2015-11-23 LAB — CBC WITH DIFFERENTIAL/PLATELET
Basophils Absolute: 0.1 10*3/uL (ref 0–0.1)
Basophils Relative: 1 %
EOS ABS: 0.3 10*3/uL (ref 0–0.7)
Eosinophils Relative: 3 %
HCT: 27.5 % — ABNORMAL LOW (ref 35.0–47.0)
HEMOGLOBIN: 9.3 g/dL — AB (ref 12.0–16.0)
LYMPHS ABS: 1.2 10*3/uL (ref 1.0–3.6)
LYMPHS PCT: 11 %
MCH: 29.4 pg (ref 26.0–34.0)
MCHC: 33.8 g/dL (ref 32.0–36.0)
MCV: 87.1 fL (ref 80.0–100.0)
MONOS PCT: 8 %
Monocytes Absolute: 0.9 10*3/uL (ref 0.2–0.9)
NEUTROS ABS: 8.6 10*3/uL — AB (ref 1.4–6.5)
NEUTROS PCT: 77 %
Platelets: 258 10*3/uL (ref 150–440)
RBC: 3.16 MIL/uL — ABNORMAL LOW (ref 3.80–5.20)
RDW: 16.9 % — ABNORMAL HIGH (ref 11.5–14.5)
WBC: 11.1 10*3/uL — ABNORMAL HIGH (ref 3.6–11.0)

## 2015-11-23 LAB — COMPREHENSIVE METABOLIC PANEL
ALT: 99 U/L — AB (ref 14–54)
AST: 87 U/L — ABNORMAL HIGH (ref 15–41)
Albumin: 3.2 g/dL — ABNORMAL LOW (ref 3.5–5.0)
Alkaline Phosphatase: 1578 U/L — ABNORMAL HIGH (ref 38–126)
Anion gap: 9 (ref 5–15)
BUN: 26 mg/dL — ABNORMAL HIGH (ref 6–20)
CHLORIDE: 101 mmol/L (ref 101–111)
CO2: 25 mmol/L (ref 22–32)
CREATININE: 1.53 mg/dL — AB (ref 0.44–1.00)
Calcium: 9.5 mg/dL (ref 8.9–10.3)
GFR calc non Af Amer: 37 mL/min — ABNORMAL LOW (ref >60)
GFR, EST AFRICAN AMERICAN: 43 mL/min — AB (ref >60)
Glucose, Bld: 122 mg/dL — ABNORMAL HIGH (ref 65–99)
POTASSIUM: 3.9 mmol/L (ref 3.5–5.1)
SODIUM: 135 mmol/L (ref 135–145)
Total Bilirubin: 0.9 mg/dL (ref 0.3–1.2)
Total Protein: 8.5 g/dL — ABNORMAL HIGH (ref 6.5–8.1)

## 2015-11-23 LAB — PHOSPHORUS: PHOSPHORUS: 3.9 mg/dL (ref 2.5–4.6)

## 2015-11-23 LAB — TSH: TSH: 9.746 u[IU]/mL — ABNORMAL HIGH (ref 0.350–4.500)

## 2015-11-23 LAB — MAGNESIUM: Magnesium: 2.3 mg/dL (ref 1.7–2.4)

## 2015-11-23 LAB — T4, FREE: FREE T4: 0.92 ng/dL (ref 0.61–1.12)

## 2015-12-11 DEATH — deceased

## 2017-07-27 IMAGING — CT CT ABD-PELV W/ CM
2 of 5 series · 15 of 46 positions shown, 17 images · IV contrast (omnipaque)
Comparison: None.

CLINICAL DATA: 54-year-old female with elevated alkaline
phosphatase and anemia.

EXAM:
CT ABDOMEN AND PELVIS WITH CONTRAST
TECHNIQUE: Multidetector CT imaging of the abdomen and pelvis was performed
using the standard protocol following bolus administration of
intravenous contrast.
CONTRAST:  100mL OMNIPAQUE IOHEXOL 300 MG/ML  SOLN

[Series 2: routine abd pel with · axial · 0.62mm/px · z∈[-488,-112]mm · 12 of 85 slices shown, 14 images]
[im 5/85  soft-tissue]
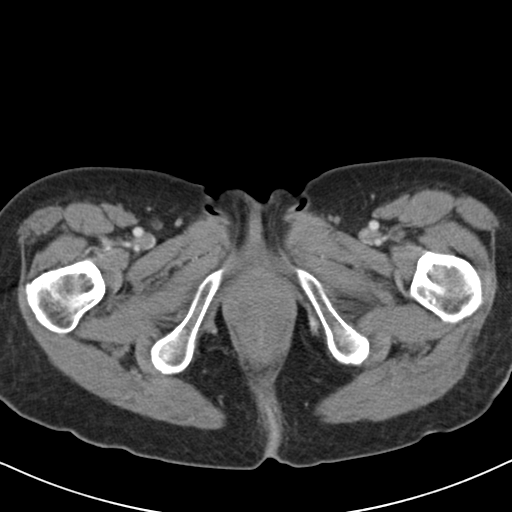
[im 5/85  bone]
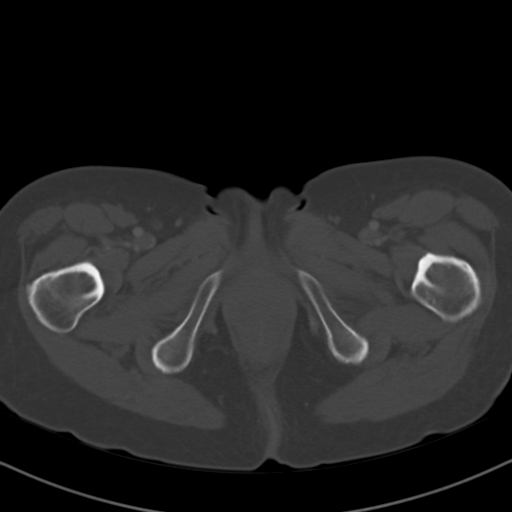
[im 15/85  soft-tissue]
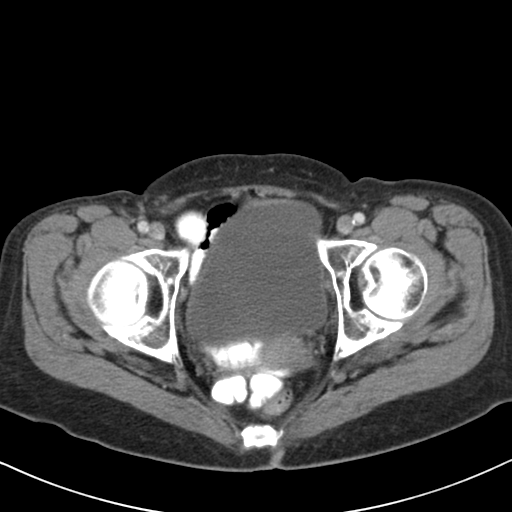
[im 20/85  soft-tissue]
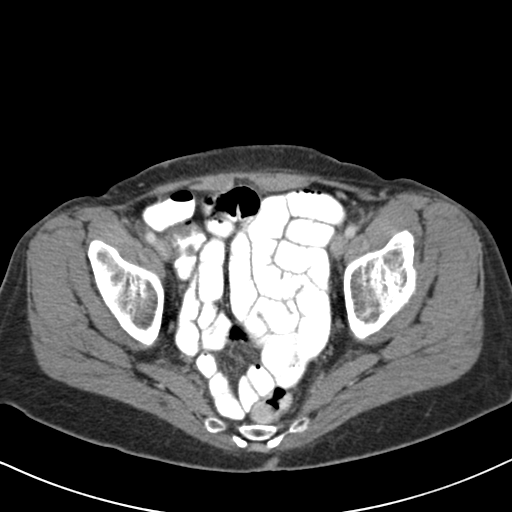
[im 25/85  soft-tissue]
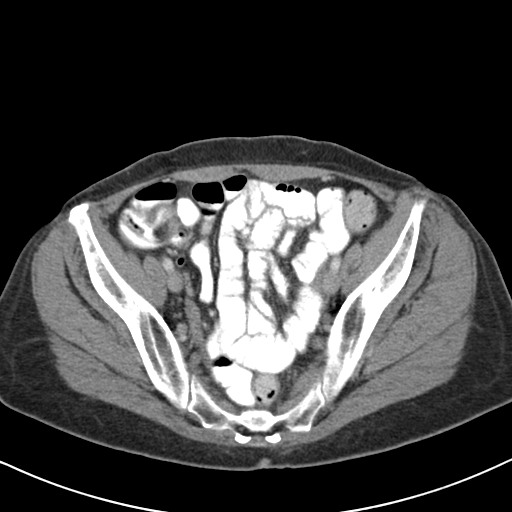
[im 35/85  soft-tissue]
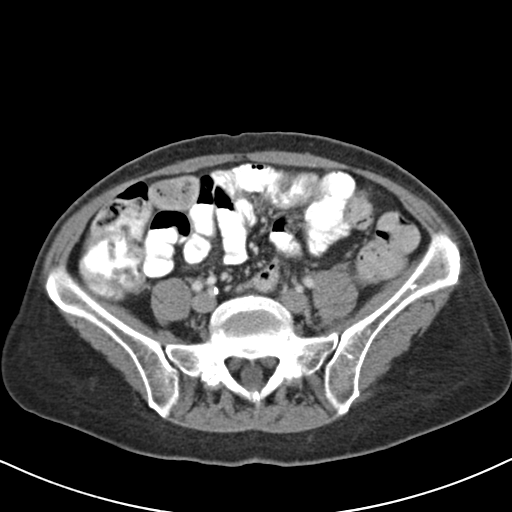
[im 40/85  soft-tissue]
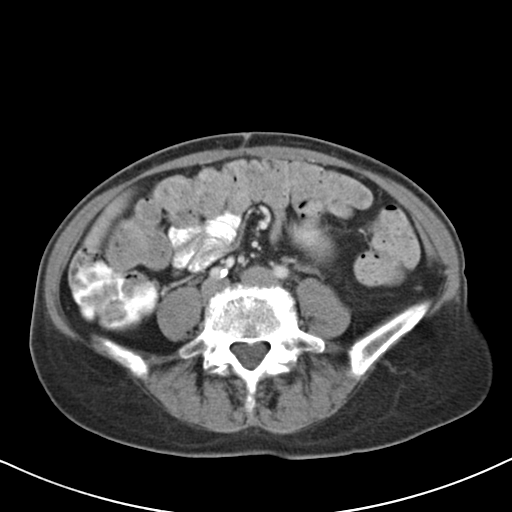
[im 45/85  soft-tissue]
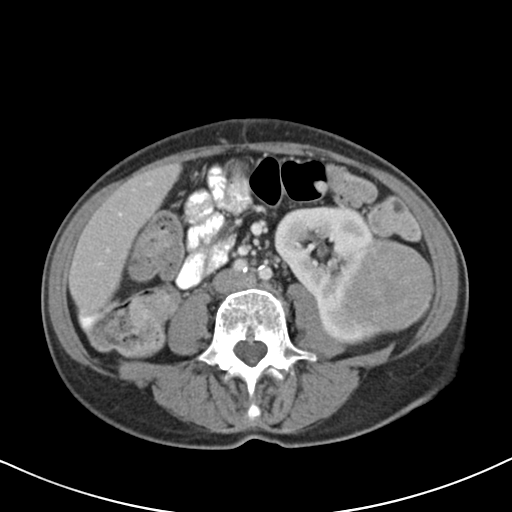
[im 55/85  soft-tissue]
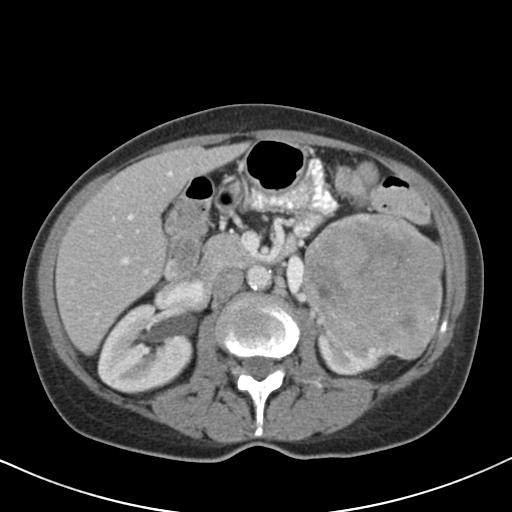
[im 60/85  soft-tissue]
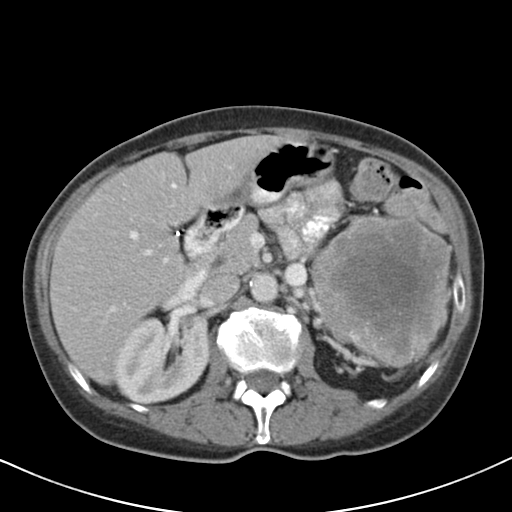
[im 60/85  bone]
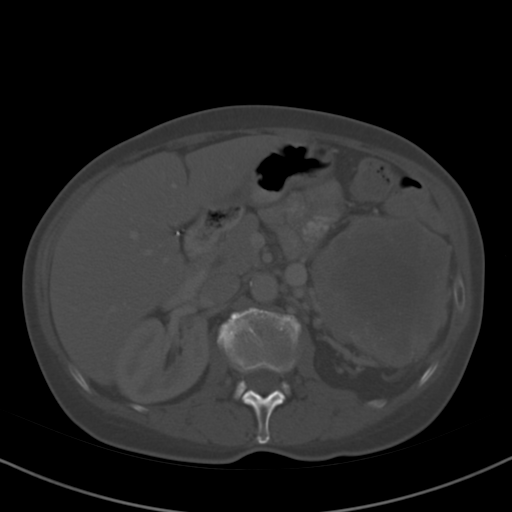
[im 65/85  soft-tissue]
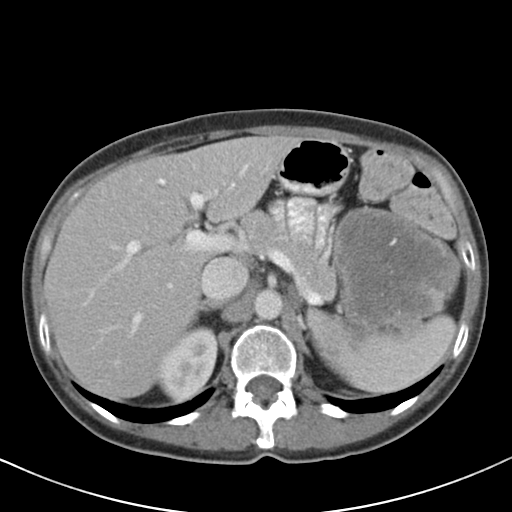
[im 75/85  soft-tissue]
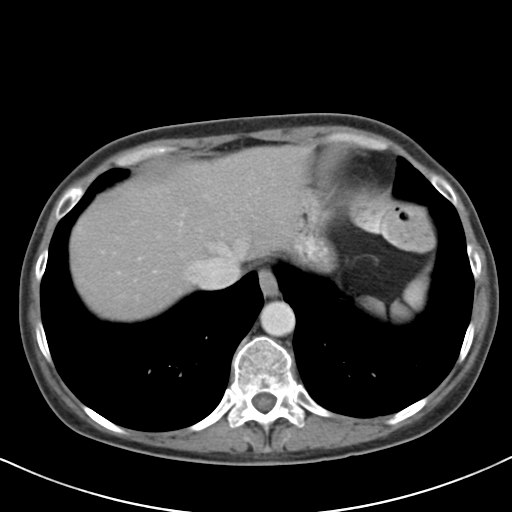
[im 80/85  soft-tissue]
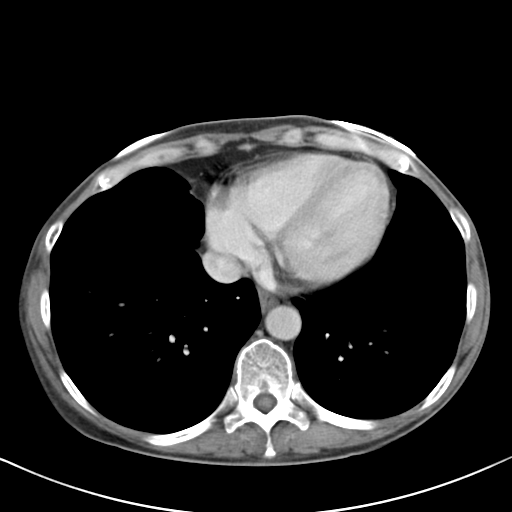

[Series 5: cor routine abd pel with · coronal · 0.59mm/px · 3 of 107 slices shown]
[im 36/107  soft-tissue]
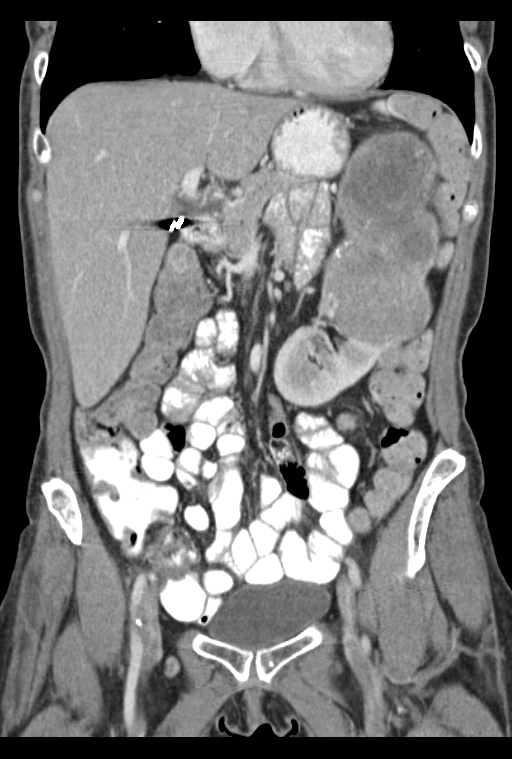
[im 48/107  soft-tissue]
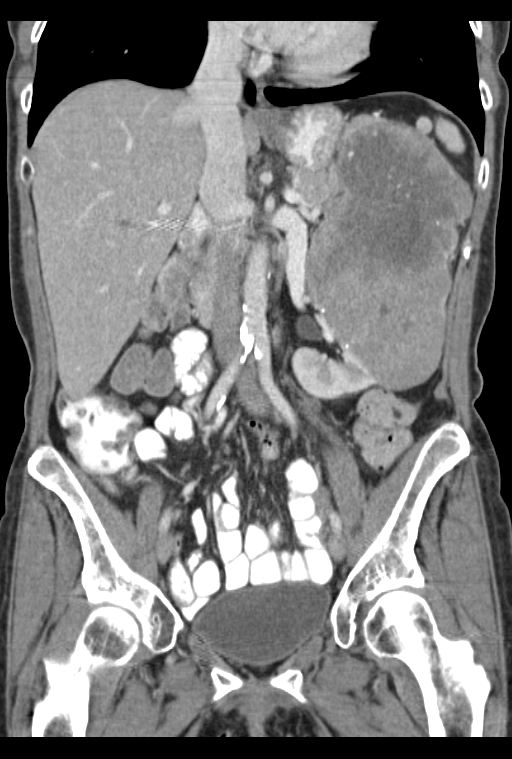
[im 59/107  soft-tissue]
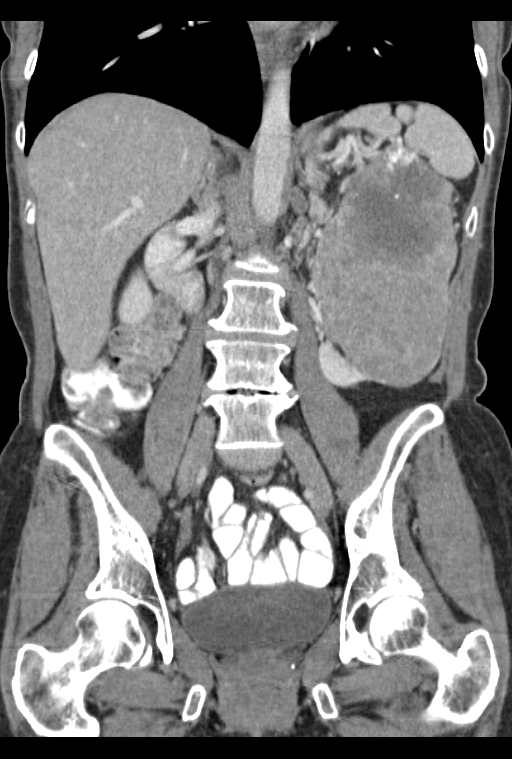

[15 of 46 positions shown; findings below may reference images not displayed]

FINDINGS: Lower Chest: The lung bases are clear. Visualized cardiac structures
are within normal limits for size. No pericardial effusion.
Unremarkable visualized distal thoracic esophagus.

Abdomen: Unremarkable CT appearance of the stomach, duodenum,
spleen, adrenal glands, and pancreas. Normal hepatic contour and
morphology. No discrete hepatic lesion. The gallbladder is
surgically absent. There is no intra or extrahepatic biliary ductal
dilatation.

Normal CT appearance of the right kidney. There is a large 15.7 x
8.5 x 9.3 cm enhancing mass lesion with areas of central necrosis
and dystrophic calcification arising exophytic from the upper pole
of the left kidney. No macroscopic invasion of the renal vein.

Mildly prominent but not enlarged by CT criteria left para-aortic
station lymph nodes measure no more than 8 mm in short axis.

No evidence of obstruction or focal bowel wall thickening. Normal
appendix in the right lower quadrant. The terminal ileum is
unremarkable. No ascites or suspicious intra abdominal adenopathy.

Pelvis: Surgical changes suggest prior hysterectomy. Unremarkable
bladder. No free fluid or adenopathy.

Bones/Soft Tissues: No acute fracture or aggressive appearing lytic
or blastic osseous lesion. Mild multilevel degenerative disc disease
most notable at L4-L5.

Vascular: Atherosclerotic vascular disease without significant
stenosis or aneurysmal dilatation.
IMPRESSION: 1. Large (15.7 cm) enhancing mass exophytic from the upper pole of
the left kidney. Imaging findings are highly concerning for a
primary renal neoplasm such as renal cell carcinoma, or papillary
carcinoma. Recommend referral for urologic consultation. No evidence
of renal vein invasion or distant metastatic disease.
2. Nonspecific left para-aortic lymph nodes are not technically
enlarged by CT criteria.
3. Additional ancillary findings as above.
These results will be called to the ordering clinician or
representative by the Radiologist Assistant, and communication
documented in the PACS or zVision Dashboard.

## 2018-03-07 IMAGING — CR DG CHEST 2V
1 series · 2 of 2 positions shown · non-contrast
Comparison: PA and lateral chest x-ray May 10, 2014

CLINICAL DATA: Open heart surgery for right ventricular tumor and
mural thrombus removal as well as pacemaker placement left week.
Patient reports cough and fever to 101.1 degrees last night. History
of lung and liver malignancy.

EXAM:
CHEST  2 VIEW

[Series 1: dg chest 2 view · 0.14mm/px · 2 of 2 slices shown]
[im 1/2]
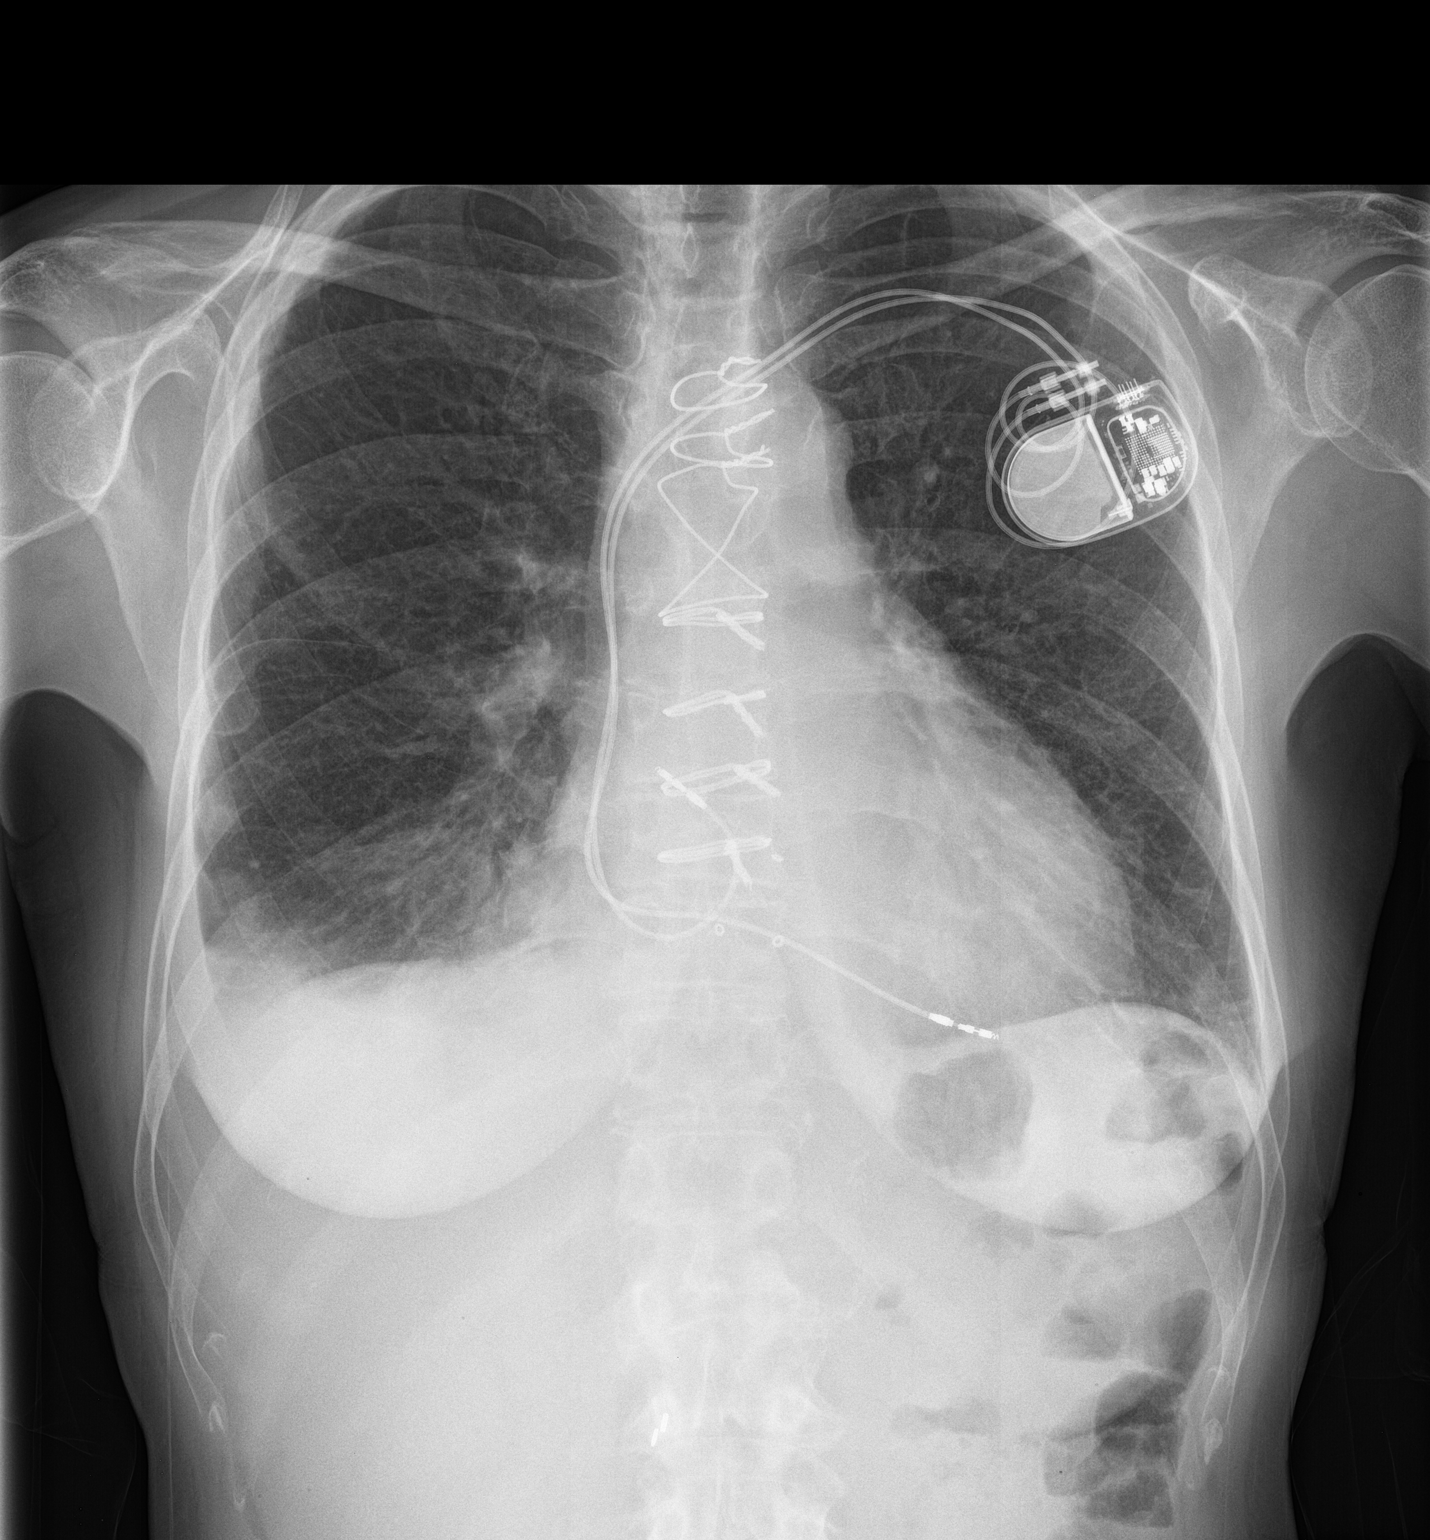
[im 2/2]
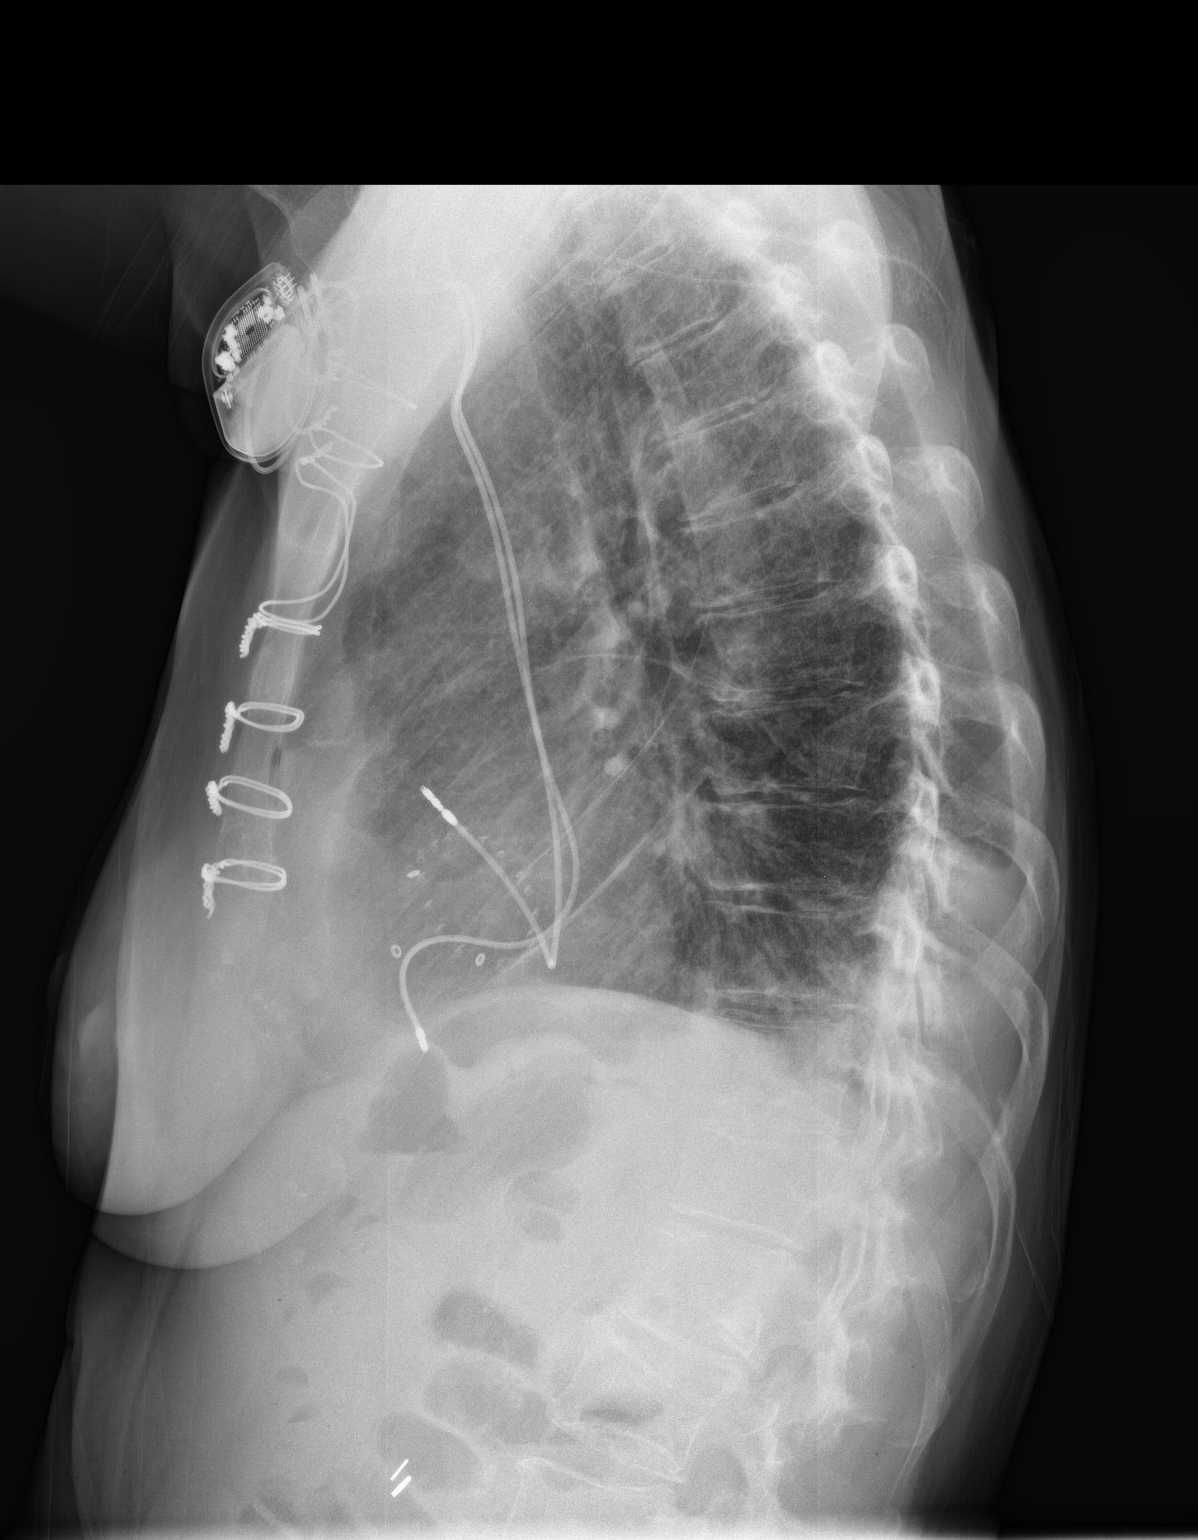

[2 of 2 positions shown; findings below may reference images not displayed]

FINDINGS: The lungs are mildly hyperinflated. There is infiltrate in the right
lower lobe posterior laterally. There is a small right pleural
effusion. The left lung is well-expanded. The interstitial markings
of both lungs are increased. The cardiac silhouette is mildly
enlarged. The circular pattern of radiodense markers is present and
may be in the tricuspid position. The pulmonary vascularity is not
clearly engorged. The sternal wires are intact. There is mild soft
tissue prominence of the retrosternal region. The pacemaker leads
are in reasonable position. There is calcification in the wall of
the aortic arch. There is mild multilevel degenerative disc disease
of the thoracic spine.
IMPRESSION: Acute right lower lobe pneumonia with small right pleural effusion.
Mild pulmonary interstitial edema. Mild cardiomegaly without
significant pulmonary vascular congestion. Post median sternotomy
changes with small amount of fluid in the retrosternal region.

Followup PA and lateral chest X-ray is recommended in 3-4 weeks
following trial of antibiotic therapy to ensure resolution and
exclude underlying malignancy.

## 2018-03-10 IMAGING — CR DG CHEST 2V
1 series · 2 of 2 positions shown · non-contrast
Comparison: Radiographs November 16, 2015.

CLINICAL DATA: Shortness of breath.

EXAM:
CHEST  2 VIEW

[Series 1: dg chest 2 view · 0.14mm/px · 2 of 2 slices shown]
[im 1/2]
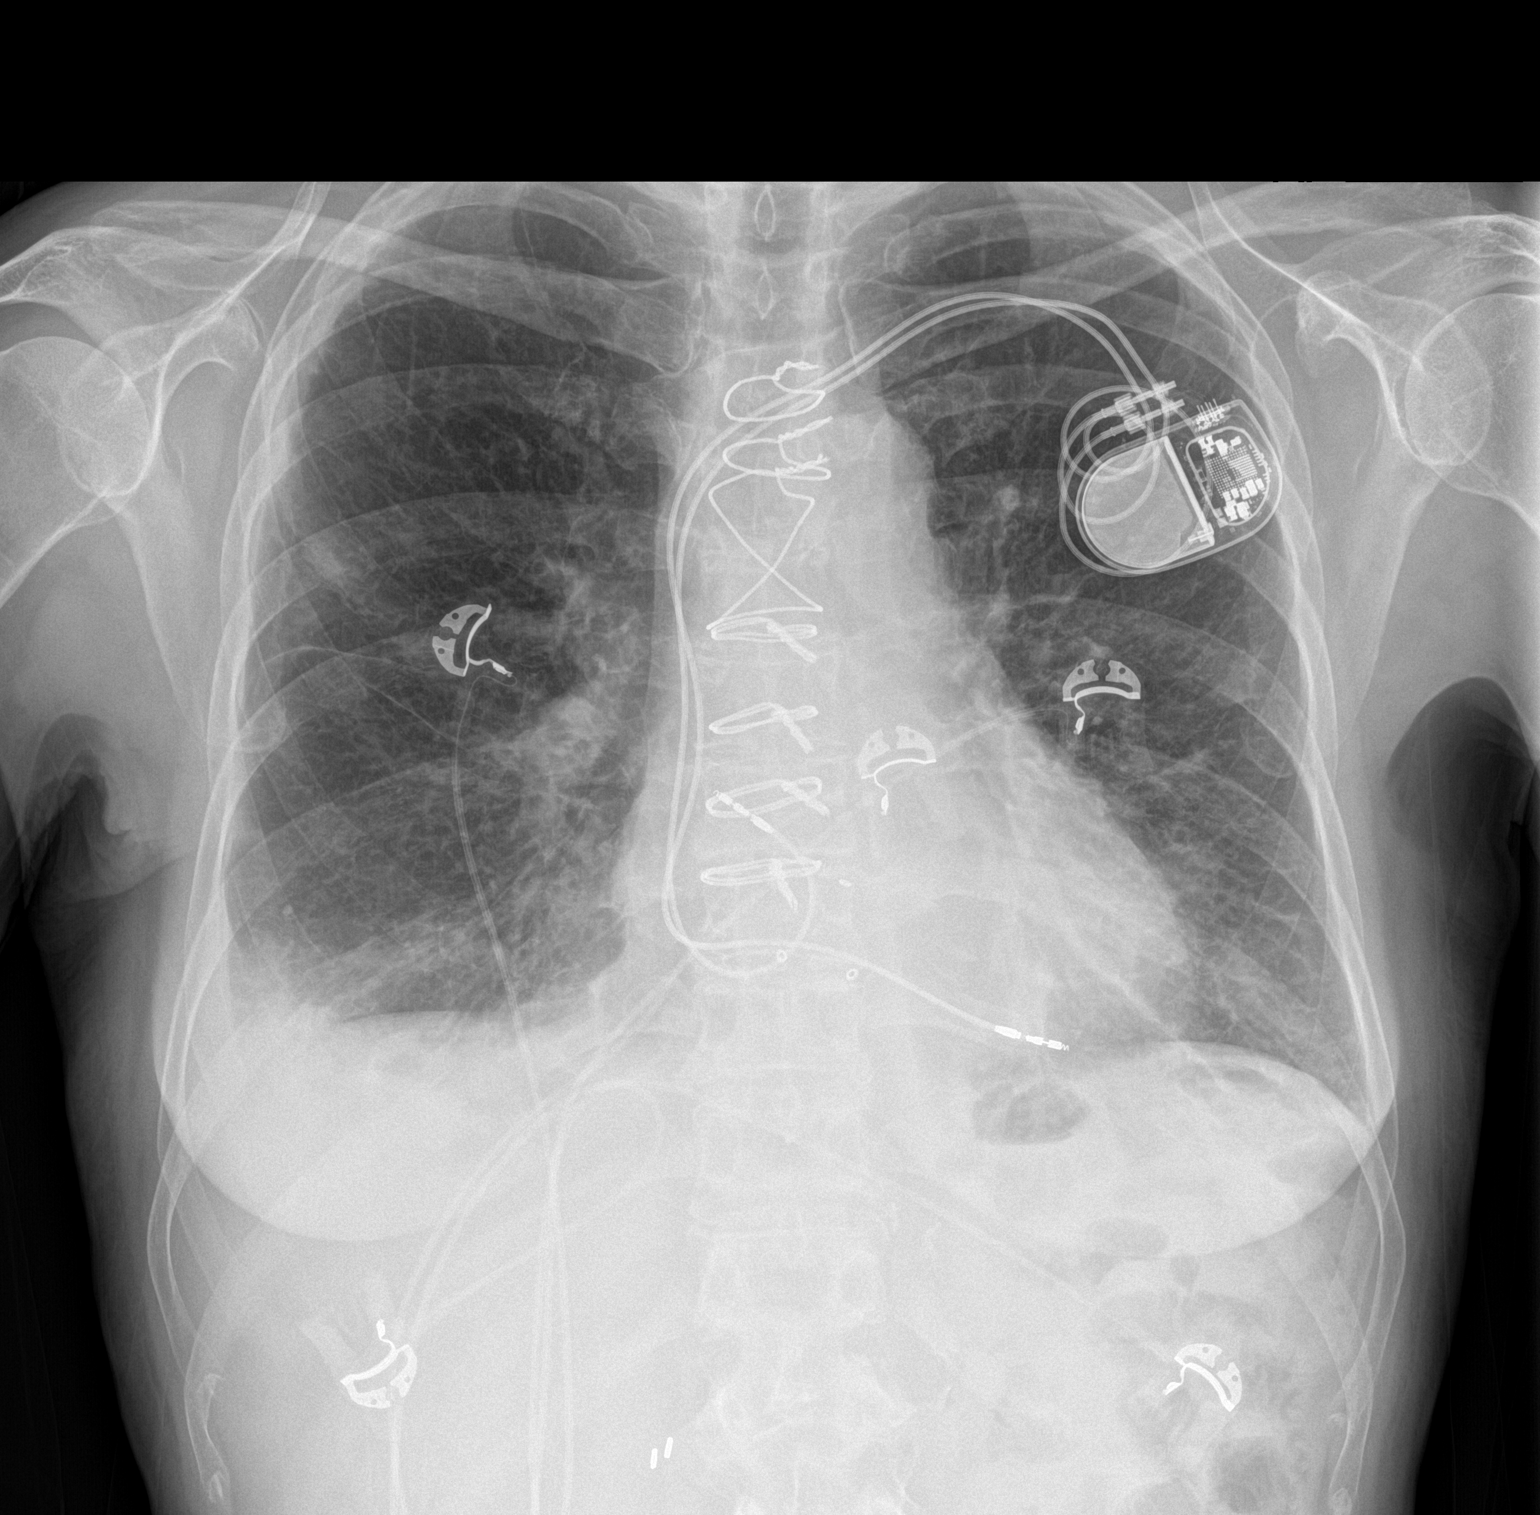
[im 2/2]
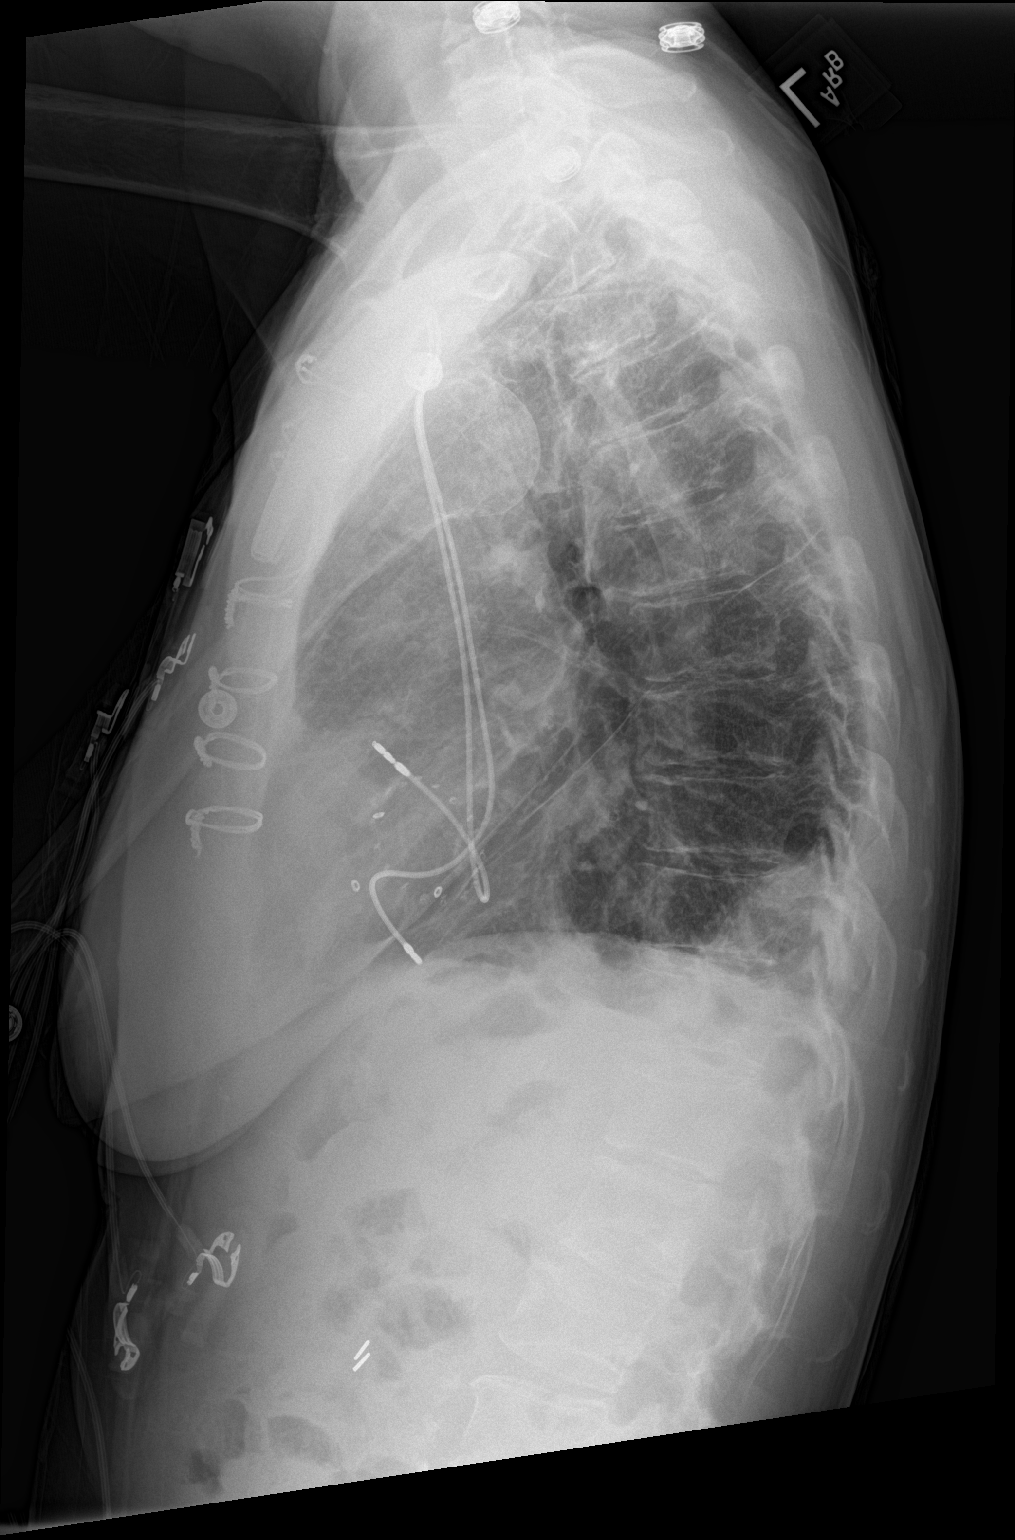

[2 of 2 positions shown; findings below may reference images not displayed]

FINDINGS: Stable cardiomediastinal silhouette. Sternotomy wires are noted.
Left-sided pacemaker is unchanged in position. No pneumothorax is
noted. Mild right basilar atelectasis or infiltrate is noted with
minimal right pleural effusion. This is stable compared to prior
exam. Bony thorax is unremarkable.
IMPRESSION: Stable right basilar subsegmental atelectasis or infiltrate with
minimal right pleural effusion.
